# Patient Record
Sex: Male | Born: 1960
Health system: Southern US, Community
[De-identification: ages and names within clinical notes are randomized; demographics above are authoritative.]

## PROBLEM LIST (undated history)

## (undated) DIAGNOSIS — F1120 Opioid dependence, uncomplicated: Secondary | ICD-10-CM

## (undated) DIAGNOSIS — E785 Hyperlipidemia, unspecified: Secondary | ICD-10-CM

## (undated) DIAGNOSIS — I209 Angina pectoris, unspecified: Secondary | ICD-10-CM

## (undated) DIAGNOSIS — Z960 Presence of urogenital implants: Secondary | ICD-10-CM

## (undated) DIAGNOSIS — I10 Essential (primary) hypertension: Secondary | ICD-10-CM

## (undated) DIAGNOSIS — R21 Rash and other nonspecific skin eruption: Secondary | ICD-10-CM

## (undated) DIAGNOSIS — N400 Enlarged prostate without lower urinary tract symptoms: Secondary | ICD-10-CM

## (undated) DIAGNOSIS — I219 Acute myocardial infarction, unspecified: Secondary | ICD-10-CM

## (undated) DIAGNOSIS — Z720 Tobacco use: Secondary | ICD-10-CM

## (undated) DIAGNOSIS — I2510 Atherosclerotic heart disease of native coronary artery without angina pectoris: Secondary | ICD-10-CM

## (undated) HISTORY — PX: APPENDECTOMY: SHX54

## (undated) HISTORY — PX: CORONARY ANGIOPLASTY: SHX604

## (undated) HISTORY — PX: HERNIA REPAIR: SHX51

## (undated) HISTORY — PX: EYE SURGERY: SHX253

---

## 1998-12-25 ENCOUNTER — Encounter: Payer: Self-pay | Admitting: Cardiology

## 1998-12-25 ENCOUNTER — Inpatient Hospital Stay (HOSPITAL_COMMUNITY): Admission: EM | Admit: 1998-12-25 | Discharge: 1998-12-30 | Payer: Self-pay | Admitting: Emergency Medicine

## 2008-11-22 HISTORY — PX: OTHER SURGICAL HISTORY: SHX169

## 2012-01-05 ENCOUNTER — Inpatient Hospital Stay (HOSPITAL_COMMUNITY)
Admission: AC | Admit: 2012-01-05 | Discharge: 2012-01-06 | DRG: 853 | Disposition: A | Discharge status: Home / Self Care | Payer: BC Managed Care – PPO | Source: Other Acute Inpatient Hospital | Attending: Internal Medicine | Admitting: Internal Medicine

## 2012-01-05 ENCOUNTER — Other Ambulatory Visit: Payer: Self-pay

## 2012-01-05 ENCOUNTER — Encounter (HOSPITAL_COMMUNITY)
Admission: AC | Disposition: A | Discharge status: Home / Self Care | Payer: Self-pay | Source: Other Acute Inpatient Hospital | Attending: Internal Medicine

## 2012-01-05 ENCOUNTER — Encounter (HOSPITAL_COMMUNITY): Payer: Self-pay | Admitting: *Deleted

## 2012-01-05 DIAGNOSIS — I2119 ST elevation (STEMI) myocardial infarction involving other coronary artery of inferior wall: Secondary | ICD-10-CM

## 2012-01-05 DIAGNOSIS — Z720 Tobacco use: Secondary | ICD-10-CM

## 2012-01-05 DIAGNOSIS — I472 Ventricular tachycardia, unspecified: Secondary | ICD-10-CM | POA: Diagnosis present

## 2012-01-05 DIAGNOSIS — I2510 Atherosclerotic heart disease of native coronary artery without angina pectoris: Secondary | ICD-10-CM

## 2012-01-05 DIAGNOSIS — I959 Hypotension, unspecified: Secondary | ICD-10-CM

## 2012-01-05 DIAGNOSIS — I214 Non-ST elevation (NSTEMI) myocardial infarction: Secondary | ICD-10-CM

## 2012-01-05 DIAGNOSIS — F1010 Alcohol abuse, uncomplicated: Secondary | ICD-10-CM | POA: Diagnosis present

## 2012-01-05 DIAGNOSIS — F17200 Nicotine dependence, unspecified, uncomplicated: Secondary | ICD-10-CM | POA: Diagnosis present

## 2012-01-05 DIAGNOSIS — I10 Essential (primary) hypertension: Secondary | ICD-10-CM | POA: Diagnosis present

## 2012-01-05 DIAGNOSIS — D45 Polycythemia vera: Secondary | ICD-10-CM | POA: Diagnosis present

## 2012-01-05 DIAGNOSIS — F1120 Opioid dependence, uncomplicated: Secondary | ICD-10-CM | POA: Diagnosis present

## 2012-01-05 DIAGNOSIS — I4729 Other ventricular tachycardia: Secondary | ICD-10-CM

## 2012-01-05 HISTORY — PX: LEFT HEART CATHETERIZATION WITH CORONARY ANGIOGRAM: SHX5451

## 2012-01-05 HISTORY — DX: Angina pectoris, unspecified: I20.9

## 2012-01-05 HISTORY — DX: Opioid dependence, uncomplicated: F11.20

## 2012-01-05 HISTORY — DX: Essential (primary) hypertension: I10

## 2012-01-05 HISTORY — DX: Atherosclerotic heart disease of native coronary artery without angina pectoris: I25.10

## 2012-01-05 HISTORY — DX: Tobacco use: Z72.0

## 2012-01-05 HISTORY — DX: Hyperlipidemia, unspecified: E78.5

## 2012-01-05 HISTORY — PX: PERCUTANEOUS CORONARY STENT INTERVENTION (PCI-S): SHX5485

## 2012-01-05 LAB — CARDIAC PANEL(CRET KIN+CKTOT+MB+TROPI)
CK, MB: 38 ng/mL (ref 0.3–4.0)
Relative Index: 7.2 — ABNORMAL HIGH (ref 0.0–2.5)
Total CK: 530 U/L — ABNORMAL HIGH (ref 7–232)
Troponin I: 2.25 ng/mL (ref <0.30)

## 2012-01-05 LAB — BASIC METABOLIC PANEL
BUN: 7 mg/dL (ref 6–23)
CO2: 26 mEq/L (ref 19–32)
Calcium: 9.9 mg/dL (ref 8.4–10.5)
Chloride: 104 mEq/L (ref 96–112)
Creatinine, Ser: 0.87 mg/dL (ref 0.50–1.35)
GFR calc Af Amer: >90 mL/min (ref >90)
GFR calc non Af Amer: >90 mL/min (ref >90)
Glucose, Bld: 101 mg/dL — ABNORMAL HIGH (ref 70–99)
Potassium: 3.8 mEq/L (ref 3.5–5.1)
Sodium: 138 mEq/L (ref 135–145)

## 2012-01-05 LAB — MRSA PCR SCREENING: MRSA by PCR: NEGATIVE

## 2012-01-05 LAB — POCT ACTIVATED CLOTTING TIME: Activated Clotting Time: 551 seconds

## 2012-01-05 SURGERY — LEFT HEART CATHETERIZATION WITH CORONARY ANGIOGRAM
Anesthesia: LOCAL

## 2012-01-05 MED ORDER — HYDROCODONE-ACETAMINOPHEN 5-325 MG PO TABS: 1.0000 | ORAL_TABLET | Freq: Four times a day (QID) | ORAL | Status: DC | PRN

## 2012-01-05 MED ORDER — PRASUGREL HCL 10 MG PO TABS
10.0000 mg | ORAL_TABLET | Freq: Every day | ORAL | Status: DC
  Administered 2012-01-06: 10 mg via ORAL
  Filled 2012-01-05: qty 1

## 2012-01-05 MED ORDER — SODIUM CHLORIDE 0.9 % IV BOLUS (SEPSIS)
500.0000 mL | Freq: Once | INTRAVENOUS | Status: AC
  Administered 2012-01-05: 04:00:00 via INTRAVENOUS

## 2012-01-05 MED ORDER — ONDANSETRON HCL 4 MG/2ML IJ SOLN: 4.0000 mg | Freq: Four times a day (QID) | INTRAMUSCULAR | Status: DC | PRN

## 2012-01-05 MED ORDER — MIDAZOLAM HCL 2 MG/2ML IJ SOLN
INTRAMUSCULAR | Status: AC
  Filled 2012-01-05: qty 2

## 2012-01-05 MED ORDER — ASPIRIN 81 MG PO CHEW
324.0000 mg | CHEWABLE_TABLET | ORAL | Status: AC
  Administered 2012-01-05: 324 mg via ORAL
  Filled 2012-01-05: qty 4

## 2012-01-05 MED ORDER — VERAPAMIL HCL 2.5 MG/ML IV SOLN
INTRAVENOUS | Status: AC
  Filled 2012-01-05: qty 2

## 2012-01-05 MED ORDER — ASPIRIN 300 MG RE SUPP
300.0000 mg | RECTAL | Status: AC
  Filled 2012-01-05: qty 1

## 2012-01-05 MED ORDER — BIVALIRUDIN 250 MG IV SOLR
INTRAVENOUS | Status: AC
  Filled 2012-01-05: qty 250

## 2012-01-05 MED ORDER — MAGNESIUM SULFATE 40 MG/ML IJ SOLN
2.0000 g | Freq: Once | INTRAMUSCULAR | Status: AC
  Administered 2012-01-05: 2 g via INTRAVENOUS
  Filled 2012-01-05: qty 50

## 2012-01-05 MED ORDER — FENTANYL CITRATE 0.05 MG/ML IJ SOLN
INTRAMUSCULAR | Status: AC
  Filled 2012-01-05: qty 2

## 2012-01-05 MED ORDER — NITROGLYCERIN 2 % TD OINT: 1.0000 [in_us] | TOPICAL_OINTMENT | Freq: Four times a day (QID) | TRANSDERMAL | Status: DC

## 2012-01-05 MED ORDER — HEPARIN SODIUM (PORCINE) 1000 UNIT/ML IJ SOLN
INTRAMUSCULAR | Status: AC
  Filled 2012-01-05: qty 1

## 2012-01-05 MED ORDER — MORPHINE SULFATE 2 MG/ML IJ SOLN: 2.0000 mg | INTRAMUSCULAR | Status: DC | PRN

## 2012-01-05 MED ORDER — PRASUGREL HCL 10 MG PO TABS
ORAL_TABLET | ORAL | Status: AC
  Filled 2012-01-05: qty 6

## 2012-01-05 MED ORDER — NITROGLYCERIN 0.2 MG/ML ON CALL CATH LAB
INTRAVENOUS | Status: AC
  Filled 2012-01-05: qty 1

## 2012-01-05 MED ORDER — ASPIRIN 325 MG PO TABS
325.0000 mg | ORAL_TABLET | Freq: Every day | ORAL | Status: DC
  Filled 2012-01-05: qty 1

## 2012-01-05 MED ORDER — SODIUM CHLORIDE 0.9 % IV SOLN
INTRAVENOUS | Status: AC
  Administered 2012-01-05: 14:00:00 via INTRAVENOUS

## 2012-01-05 MED ORDER — HEPARIN SOD (PORCINE) IN D5W 100 UNIT/ML IV SOLN
1000.0000 [IU]/h | INTRAVENOUS | Status: DC
  Administered 2012-01-05: 1000 [IU]/h via INTRAVENOUS
  Filled 2012-01-05: qty 250

## 2012-01-05 MED ORDER — MORPHINE SULFATE 2 MG/ML IJ SOLN
INTRAMUSCULAR | Status: AC
  Administered 2012-01-05: 2 mg via INTRAVENOUS
  Filled 2012-01-05: qty 18

## 2012-01-05 MED ORDER — LIDOCAINE HCL (PF) 1 % IJ SOLN
INTRAMUSCULAR | Status: AC
  Filled 2012-01-05: qty 30

## 2012-01-05 MED ORDER — ACETAMINOPHEN 325 MG PO TABS
650.0000 mg | ORAL_TABLET | ORAL | Status: DC | PRN
  Administered 2012-01-05: 650 mg via ORAL
  Filled 2012-01-05: qty 2

## 2012-01-05 MED ORDER — ASPIRIN EC 325 MG PO TBEC: 325.0000 mg | DELAYED_RELEASE_TABLET | Freq: Every day | ORAL | Status: DC

## 2012-01-05 MED ORDER — ROSUVASTATIN CALCIUM 40 MG PO TABS
40.0000 mg | ORAL_TABLET | Freq: Every day | ORAL | Status: DC
  Administered 2012-01-06: 40 mg via ORAL
  Filled 2012-01-05 (×2): qty 1

## 2012-01-05 MED ORDER — SODIUM CHLORIDE 0.9 % IV SOLN
INTRAVENOUS | Status: DC
  Administered 2012-01-05: 06:00:00 via INTRAVENOUS

## 2012-01-05 MED ORDER — HEPARIN (PORCINE) IN NACL 2-0.9 UNIT/ML-% IJ SOLN
INTRAMUSCULAR | Status: AC
  Filled 2012-01-05: qty 1000

## 2012-01-05 MED ORDER — ASPIRIN 81 MG PO CHEW
81.0000 mg | CHEWABLE_TABLET | Freq: Every day | ORAL | Status: DC
  Administered 2012-01-06: 81 mg via ORAL
  Filled 2012-01-05: qty 1

## 2012-01-05 MED ORDER — HEART ATTACK BOUNCING BOOK
Freq: Once | Status: DC
  Filled 2012-01-05: qty 1

## 2012-01-05 MED ORDER — HYDROMORPHONE HCL PF 2 MG/ML IJ SOLN
INTRAMUSCULAR | Status: AC
  Filled 2012-01-05: qty 1

## 2012-01-05 MED ORDER — NITROGLYCERIN 0.4 MG SL SUBL: 0.4000 mg | SUBLINGUAL_TABLET | SUBLINGUAL | Status: DC | PRN

## 2012-01-05 MED FILL — Heparin Sodium (Porcine) 100 Unt/ML in Sodium Chloride 0.45%: INTRAMUSCULAR | Qty: 250 | Status: AC

## 2012-01-05 MED FILL — Fentanyl Citrate Inj 0.05 MG/ML: INTRAMUSCULAR | Qty: 2 | Status: AC

## 2012-01-05 NOTE — Progress Notes (Signed)
I have seen [name] this am and reviewed his chart. Labs are ok for cath. Risks and benefits of procedure reviewed with pt. He agrees to proceed. Left heart cath with possible PCI this am from right radial artery.   Zaryia Markel 8:20 AM 01/05/2012

## 2012-01-05 NOTE — Interval H&P Note (Signed)
History and Physical Interval Note:  01/05/2012 9:38 AM  Adrian Rivera  has presented today for cardiac catheterization with possible PCI with the diagnosis of cp  The various methods of treatment have been discussed with the patient and family. After consideration of risks, benefits and other options for treatment, the patient has consented to  Procedure(s) (LRB): LEFT HEART CATHETERIZATION WITH CORONARY ANGIOGRAM (N/A) as a surgical intervention .  The patients' history has been reviewed, patient examined, no change in status, stable for surgery.  I have reviewed the patients' chart and labs.  Questions were answered to the patient's satisfaction.     Madelynne Lasker

## 2012-01-05 NOTE — Progress Notes (Signed)
ANTICOAGULATION CONSULT NOTE - Initial Consult  Pharmacy Consult for heparin   Indication: chest pain/ACS  No Known Allergies  Patient Measurements: Height: 5\' 10"  (177.8 cm) Weight: 161 lb 9.6 oz (73.3 kg) IBW/kg (Calculated) : 73  Heparin Dosing Weight:   Vital Signs: Temp: 97.7 F (36.5 C) (02/13 0139) Temp src: Oral (02/13 0139) BP: 93/61 mmHg (02/13 0139) Pulse Rate: 45  (02/13 0139)  Labs: No results found for this basename: HGB:2,HCT:3,PLT:3,APTT:3,LABPROT:3,INR:3,HEPARINUNFRC:3,CREATININE:3,CKTOTAL:3,CKMB:3,TROPONINI:3 in the last 72 hours CrCl is unknown because no creatinine reading has been taken.  Medical History: Past Medical History  Diagnosis Date  . Angina   . Hypertension   . Headache     Medications:  No prescriptions prior to admission    Assessment: ACS transfer from [facility]. Heparin infusing at 1000 units/hr  Goal of Therapy:  Heparin level 0.3-0.7 units/ml   Plan:  Continue at 1000 units/hr. HL at 0800. Daily HL and CBC starting 2/14 with am labs if still continued  Janice Coffin 01/05/2012,2:16 AM

## 2012-01-05 NOTE — H&P (Addendum)
PCP: Doreen Beam  HPI:  51 y/o male with HTN, HL, ongoing tobacco/ETOH/oxycontin use and CAD. Transferred from [place] with MI.  Reports having a cath in 2000 at [facility] (no records in [place]). Says 1 artery was totally blocked and got stents to the other 2. Has not followed with cardiologist since. Smokes 25 cigs per day and reports 8 beers on the weekend.   Today about 3p had CP. Got better then recurred at 6p. Went to [place] with 5/10 CP. Initial ECG with inferior Q waves and mild ST elevation. Quickly made pain free with NTG and fentanyl. Heparin started. 1st set of CE negative. Transferred here. ECG with progressive inferior Qs and resolution of ST elevation.  BP 89/50. Feels fine. 15 beat run NSVT (asx). Denies HF, claudication, melena, BRBPR or DM2.  Review of Systems:     Cardiac Review of Systems: {Y] = yes [ ]  = no  Chest Pain [ y   ]  Resting SOB [   ] Exertional SOB  [  ]  Orthopnea [  ]   Pedal Edema [   ]    Palpitations [  ] Syncope  [  ]   Presyncope [   ]  General Review of Systems: [Y] = yes [  ]=no Constitional: recent weight change [  ]; anorexia [  ]; fatigue [  ]; nausea [  ]; night sweats [  ]; fever [  ]; or chills [  ];                                                                 Eye : blurred vision [  ]; diplopia [   ]; vision changes [  ];  Amaurosis fugax[  ]; Resp: cough [  ];  wheezing[  ];  hemoptysis[  ]; shortness of breath[  ]; paroxysmal nocturnal dyspnea[  ]; dyspnea on exertion[  ]; or orthopnea[  ];  GI:  gallstones[  ], vomiting[  ];  dysphagia[  ]; melena[  ];  hematochezia [  ]; heartburn[  ];   GU: kidney stones [  ]; hematuria[  ];   dysuria [  ];  nocturia[  ];  history of     obstruction [  ];                 Skin: rash, swelling[  ];, hair loss[  ];  peripheral edema[  ];  or itching[  ]; Musculosketetal: myalgias[  ];  joint swelling[  ];  joint erythema[  ];  joint pain[  ];  back pain[  ];  Heme/Lymph: bruising[  ];  bleeding[  ];   anemia[  ];  Neuro: TIA[  ];  headaches[  ];  stroke[  ];  vertigo[  ];  seizures[  ];   paresthesias[  ];  difficulty walking[  ];  Psych:depression[  ]; anxiety[  ];  Endocrine: diabetes[  ];  thyroid dysfunction[  ];   Other:  Past Medical History  Diagnosis Date  . Angina   . Hypertension   . Headache   . CAD (coronary artery disease)     cath 200. 1 artery block. other 2 arteries stented  . Tobacco use   . Alcohol  abuse, episodic     8 beers per weekend  . Opioid dependence   . Hyperlipidemia     Medications Prior to Admission  Medication Dose Route Frequency Provider Last Rate Last Dose  . aspirin tablet 325 mg  325 mg Oral Daily Bevelyn Buckles Adelia Baptista, MD      . heparin ADULT infusion 100 units/ml (25000 units/250 ml)  1,000 Units/hr Intravenous Continuous Dolores Patty, MD       No current outpatient prescriptions on file as of 01/05/2012.  Meds:  Lopressor Fish oil ASA 81 Lisinopril Lovastatin    (unknown doses)   No Known Allergies  History   Social History  . Marital Status: Married    Spouse Name: N/A    Number of Children: N/A  . Years of Education: N/A   Occupational History  . Not on file.   Social History Main Topics  . Smoking status: Current Everyday Smoker -- 1.2 packs/day for 18 years    Types: Cigarettes  . Smokeless tobacco: Not on file  . Alcohol Use: 4.8 oz/week    8 Cans of beer per week  . Drug Use: 6 per week    Special: Hydromorphone  . Sexually Active: Yes   Other Topics Concern  . Not on file   Social History Narrative  . No narrative on file    No family history on file.  PHYSICAL EXAM: Filed Vitals:   01/05/12 0315  BP: 96/59  Pulse: 59  Temp:   Resp: 16   General:  Well appearing. No respiratory difficulty HEENT: normal Neck: supple. no JVD. Carotids 2+ bilat; no bruits. No lymphadenopathy or thryomegaly appreciated. Cor: PMI nondisplaced. Regular rate & rhythm. No rubs, gallops or murmurs. Lungs:  clear Abdomen: soft, nontender, nondistended. No hepatosplenomegaly. No bruits or masses. Good bowel sounds. Extremities: no cyanosis, clubbing, rash, edema Neuro: alert & oriented x 3, cranial nerves grossly intact. moves all 4 extremities w/o difficulty. Affect pleasant.  ECG: Sinus brady 48 Inferior Qs. Probable posterior involvement  Results for orders placed during the hospital encounter of 01/05/12 (from the past 24 hour(s))  CARDIAC PANEL(CRET KIN+CKTOT+MB+TROPI)     Status: Abnormal   Collection Time   01/05/12  1:55 AM      Component Value Range   Total CK 530 (*) 7 - 232 (U/L)   CK, MB 38.0 (*) 0.3 - 4.0 (ng/mL)   Troponin I 2.25 (*) <0.30 (ng/mL)   Relative Index 7.2 (*) 0.0 - 2.5    No results found.  CXR: Minimal atelectasis   Labs from [facility]:  Na 138 K 3.8 CR 0.97 Glu 100  WBC 7.4 HgB 15.2  Plt 205 MCV 92  INR 1.0  ASSESSMENT:  1. Inferior-posterior MI  2. H/o CAD s/p 2 vessel stenting in 2000 3. Ongoing tobacco use 4. ETOH abuse  5. Opioid (Oxycontin) dependence 6. HTN 7. HL 8. NSVT 9. Polycythemia 10. Hypotension  PLAN/DISCUSSION:  Currently pain free with resolution of ST elevation. Will treat with ASA, NTG (as BP tolerates),  Statin and heparin. Gentle IVF. No thienopyridine due to known multivessel CAD. Keep K+ > 4.0 and mag > 2.0. No b-blocker yet due to bradycardia and hypotension. Watch for withdrawal symptoms.  Smoking cessation consult placed. Cath later today.   Jakelin Taussig,MD 3:59 AM

## 2012-01-05 NOTE — Op Note (Signed)
Cardiac Catheterization Operative Report  Ishmail Mcmanamon [id number] 2/13/201311:02 AM No primary provider on file.  Procedure Performed:  1. Left Heart Catheterization 2. Selective Coronary Angiography 3. Left ventricular angiogram 4. PTCA/DES x 1 OM2 5. PTCA/DES x 1 mid Circumflex (different lesions)  Operator: Verne Carrow, MD  Indication:  NSTEMI. Pt with known CAD with cath in 2000 with bare metal stent placed in the mid RCA and in the mid LAD for CTO of the mid LAD. He has continued to smoke daily over the last 13 years. He is now admitted with a positive troponin.                                      Procedure Details: The risks, benefits, complications, treatment options, and expected outcomes were discussed with the patient. The patient and/or family concurred with the proposed plan, giving informed consent. The patient was brought to the cath lab after IV hydration was begun and oral premedication was given. The patient was further sedated with Versed and Fentanyl. The right wrist was assessed with an Allen's test. The right wrist was prepped and draped in a sterile fashion. 1% lidocaine was used for local anesthesia. A 5 French sheath was placed in the right radial artery. 3 mg IV Verapamil was given through the sheath. 4000 units IV heparin was given.  Standard diagnostic catheters were used to perform selective coronary angiography. A pigtail catheter was used to perform a left ventricular angiogram. I initially thought the LAD was occluded distally and made plans to end the case and consider CABG. The sheath was removed from the right wrist. A Terumo hemostasis band was applied to the right wrist. I then reviewed the films more closely and it was apparent that the LAD was a small caliber, diminutive vessel beyond the stented segment. The mid LAD stent actually extended into the diagonal branch and was patent. It was felt that the RCA occlusion was chronic and the likely  culprit vessel was the mid Circumflex or OM2 lesion. I discussed the films with my colleagues while leaving the patient on the table. I decided to pursue PTCA of the circumflex lesions and continue medical management of the other disease.   The right groin was prepped and draped in the usual manner. Using the modified Seldinger access technique, a 6 French sheath was placed in the right femoral artery. The patient was given a bolus of Angiomax and a drip was started. He was given 60 mg Effient po. I then engaged the left main artery with a 6 French XB 3.0 guiding catheter. When the ACT was greater than 200, I passed a Cougar IC wire down the Circumflex into the first OM branch. I then passed a second Cougar IC wire into the second OM branch. A 2.0 x 12 mm balloon was used to post-dilate the stenosis in the second OM branch. I then used a 2.5 x 12 mm balloon to pre-dilate the stenosis in the mid vessel as I could not pass the stent beyond it. A 2.25 x 16 mm Promus Element DES was deployed in the second OM branch. This was a small vessel with good expansion of the stent. No post-dilatation was performed. I then turned my attention to the mid lesion and placed a 3.5 x 20 mm Promus Element DES in the mid vessel. A 3.75 x 15 mm [place] balloon was used to post-dilate  the stent. There was an excellent result with good flow into the distal vessel. An Angioseal femoral artery closure was placed in the right femoral artery.   There were no immediate complications. The patient was taken to the recovery area in stable condition.   Hemodynamic Findings: Central aortic pressure: 109/68 Left ventricular pressure: 109/6/13  Angiographic Findings:  Left main: No obstructive disease.   Left Anterior Descending Artery: This is a moderate sized vessel throughout the proximal and mid segments. There is a 50% stenosis in the proximal vessel at the takeoff of the first septal branch. The mid vessel has a patent stent that  extends down into the Diagonal branch. This is patent with minimal in-stent restenosis. There is no distribution of the LAD to the apex in a classic pattern. There is a small branch after the diagonal that does not reach the apex.   Circumflex Artery: Large, dominant vessel with ulcerated 80% stenosis mid vessel. The distal AV groove Circumflex has a 99% stenosis.   Right Coronary Artery: 100% occlusion mid vessel within the previously placed stent. The distal vessel is seen to fill from left to right collaterals.   Left Ventricular Angiogram: LVEF 60%. No wall motion abnormalities.   Impression: 1. Triple vessel CAD with chronic occlusion of the mid RCA, patent stent LAD, new stenoses in the Circumflex that are most likely the culprit for his NSTEMI.  2. Preserved LV systolic function 3. PTCA/DES x 1 OM2 4. PTCA/DES x 1 mid Circumflex  Recommendations: ASA, Effient, beta blocker, statin. Tobacco cessation.        Complications:  None. The patient tolerated the procedure well.

## 2012-01-06 ENCOUNTER — Encounter (HOSPITAL_COMMUNITY): Payer: Self-pay

## 2012-01-06 ENCOUNTER — Other Ambulatory Visit: Payer: Self-pay

## 2012-01-06 DIAGNOSIS — I214 Non-ST elevation (NSTEMI) myocardial infarction: Secondary | ICD-10-CM

## 2012-01-06 LAB — CBC
HCT: 44.9 % (ref 39.0–52.0)
Hemoglobin: 15.7 g/dL (ref 13.0–17.0)
MCH: 31.3 pg (ref 26.0–34.0)
MCHC: 35 g/dL (ref 30.0–36.0)
MCV: 89.6 fL (ref 78.0–100.0)
Platelets: 206 10*3/uL (ref 150–400)
RBC: 5.01 MIL/uL (ref 4.22–5.81)
RDW: 13.4 % (ref 11.5–15.5)
WBC: 9.9 10*3/uL (ref 4.0–10.5)

## 2012-01-06 LAB — HEMOGLOBIN A1C
Hgb A1c MFr Bld: 5.7 % — ABNORMAL HIGH (ref <5.7)
Mean Plasma Glucose: 117 mg/dL — ABNORMAL HIGH (ref <117)

## 2012-01-06 LAB — BASIC METABOLIC PANEL
BUN: 6 mg/dL (ref 6–23)
CO2: 25 mEq/L (ref 19–32)
Calcium: 10 mg/dL (ref 8.4–10.5)
Chloride: 106 mEq/L (ref 96–112)
Creatinine, Ser: 0.9 mg/dL (ref 0.50–1.35)
GFR calc Af Amer: >90 mL/min (ref >90)
GFR calc non Af Amer: >90 mL/min (ref >90)
Glucose, Bld: 111 mg/dL — ABNORMAL HIGH (ref 70–99)
Potassium: 3.8 mEq/L (ref 3.5–5.1)
Sodium: 139 mEq/L (ref 135–145)

## 2012-01-06 LAB — LIPID PANEL
Cholesterol: 195 mg/dL (ref 0–200)
HDL: 52 mg/dL (ref >39)
LDL Cholesterol: 113 mg/dL — ABNORMAL HIGH (ref 0–99)
Total CHOL/HDL Ratio: 3.8 RATIO
Triglycerides: 149 mg/dL (ref <150)
VLDL: 30 mg/dL (ref 0–40)

## 2012-01-06 MED ORDER — METOPROLOL TARTRATE 25 MG PO TABS: 25.0000 mg | ORAL_TABLET | Freq: Two times a day (BID) | ORAL | Status: DC

## 2012-01-06 MED ORDER — ASPIRIN 81 MG PO CHEW: 81.0000 mg | CHEWABLE_TABLET | Freq: Every day | ORAL | Status: AC

## 2012-01-06 MED ORDER — ATORVASTATIN CALCIUM 40 MG PO TABS: 40.0000 mg | ORAL_TABLET | Freq: Every day | ORAL | Status: DC

## 2012-01-06 MED ORDER — METOPROLOL TARTRATE 25 MG PO TABS
25.0000 mg | ORAL_TABLET | Freq: Two times a day (BID) | ORAL | Status: DC
  Administered 2012-01-06: 25 mg via ORAL
  Filled 2012-01-06: qty 1

## 2012-01-06 MED ORDER — NITROGLYCERIN 0.4 MG SL SUBL: 0.4000 mg | SUBLINGUAL_TABLET | SUBLINGUAL | Status: DC | PRN

## 2012-01-06 MED ORDER — PRASUGREL HCL 10 MG PO TABS: 10.0000 mg | ORAL_TABLET | Freq: Every day | ORAL | Status: DC

## 2012-01-06 MED FILL — Dextrose Inj 5%: INTRAVENOUS | Qty: 50 | Status: AC

## 2012-01-06 NOTE — Progress Notes (Signed)
Pt smokes 2 ppd and is in contemplation stage. Discussed risk factors and how it affects his health. Pt verbalizes understanding. Discussed different quit options. Pt doesn't think he'll need medical aids to help him quit and will quit cold [place] when he is ready. Referred to 1-800 quit now for f/u and support. Discussed oral fixation substitutes, second hand smoke and in home smoking policy. Reviewed and gave pt Written education/contact information.

## 2012-01-06 NOTE — Discharge Summary (Signed)
Pt seen and full note written this am. cdm

## 2012-01-06 NOTE — Progress Notes (Signed)
UR Completed. Simmons, Koven Belinsky F [phone]  

## 2012-01-06 NOTE — Discharge Summary (Signed)
Discharge Summary   Patient ID: Adrian Rivera,  MRN: [id number], DOB/AGE: [birth year] 51 y.o.  Admit date: 01/05/2012 Discharge date: 01/06/2012  Discharge Diagnoses Principal Problem:  *NSTEMI (non-ST elevated myocardial infarction) Active Problems:  Inferior MI  NSVT (nonsustained ventricular tachycardia)  CAD (coronary artery disease)  Hypotension  Tobacco use   Allergies No Known Allergies  Procedures  Cardiac Catheterization with PCI  Hemodynamic Findings:  Central aortic pressure: 109/68  Left ventricular pressure: 109/6/13  Angiographic Findings:  Left main: No obstructive disease.  Left Anterior Descending Artery: This is a moderate sized vessel throughout the proximal and mid segments. There is a 50% stenosis in the proximal vessel at the takeoff of the first septal branch. The mid vessel has a patent stent that extends down into the Diagonal branch. This is patent with minimal in-stent restenosis. There is no distribution of the LAD to the apex in a classic pattern. There is a small branch after the diagonal that does not reach the apex.  Circumflex Artery: Large, dominant vessel with ulcerated 80% stenosis mid vessel. The distal AV groove Circumflex has a 99% stenosis.  Right Coronary Artery: 100% occlusion mid vessel within the previously placed stent. The distal vessel is seen to fill from left to right collaterals.  Left Ventricular Angiogram: LVEF 60%. No wall motion abnormalities.  Impression:  1. Triple vessel CAD with chronic occlusion of the mid RCA, patent stent LAD, new stenoses in the Circumflex that are most likely the culprit for his NSTEMI.  2. Preserved LV systolic function  3. PTCA/DES x 1 OM2  4. PTCA/DES x 1 mid Circumflex  Recommendations: ASA, Effient, beta blocker, statin. Tobacco cessation.  Complications: None. The patient tolerated the procedure well.   History of Present Illness  Mr. Woodford is a 51 yo male with a PMHx significant for  HTN, HL, tobacco, alcohol and oxycontin use and CAD (s/p BMS-RCA and mid LAD in 2000) who was trasferred from [facility] with NSTEMI.   He reported having a cath in 2000 at [facility] (no records in [place]). Says 1 artery was totally blocked and got stents to the other 2. Had not followed with cardiologist since. Smokes 25 cigs per day and reports 8 beers on the weekend.   On 02/13, about 3p had CP. Got better then recurred at 6p. Went to [place] with 5/10 CP. Initial ECG with inferior Q waves and mild ST elevation. Quickly made pain free with NTG and fentanyl. Heparin started. 1st set of CE negative. Transferred here. ECG with progressive inferior Qs and resolution of ST elevation.   BP 89/50. Feels fine. 15 beat run NSVT (asx). Denies HF, claudication, melena, BRBPR or DM2. CEs while at [facility] were elevated.  He was subsequently admitted for ACS with scheduled cardiac catheterization the same day.  Hospital Course   He was heparinized, gently hydrated and BB held 2/2 mild bradycardia and hypotension. Thienopyridine was initially held due to stated history of multivessel CAD. He was informed, consented and agreed to the procedure which revealed the above details including triple vessel CAD with chronic occlusion of the mid RCA, patent stent LAD, new stenoses in the Circumflex that were the designated culprit lesions for NSTEMI. He was found to have preserved LV systolic function. Interventions included PTCA/DES x 1 OM2 and PTCA/DES x 1 mid LCx. The recommendation was made for DAPT- ASA/Effient, BB, statin and tobacco cessation. He had no immediate complications post-procedure.   There were no overnight events on telemetry or EKG.  He is asymptomatic today. Catheterization site without evidence of infection, hematoma or pseudoaneurysm. He will be discharged on ASA/Effient/low-dose Lopressor/statin/NTG SL PRN. He has undergone tobacco cessation counseling in the hospital and this has been stressed. I have given him  prescriptions for Effient x 1 month without refills, and Effient x 3 months with refills to satisfy requirements for assistance with this med. I have also given him Lipitor 40mg  to replace home Mevacor 20mg  due to LDL of 117 this hospitalization. This will be followed and managed in the [address] office. I have made an appointment with him with Dr. Kirke Corin in approx 2 weeks. This information including activity restrictions have been clearly outlined for him on the discharge summary.   Discharge Vitals:  Blood pressure 121/67, pulse 92, temperature 98.4 F (36.9 C), temperature source Oral, resp. rate 20, height 5\' 10"  (1.778 m), weight 74.3 kg (163 lb 12.8 oz), SpO2 98.00%.  Weight change: 1 kg (2 lb 3.3 oz)  Labs: Recent Labs  Basename 01/06/12 0500   WBC 9.9   HGB 15.7   HCT 44.9   MCV 89.6   PLT 206    Lab 01/06/12 0500 01/05/12 1422  NA 139 138  K 3.8 3.8  CL 106 104  CO2 25 26  BUN 6 7  CREATININE 0.90 0.87  CALCIUM 10.0 9.9  PROT -- --  BILITOT -- --  ALKPHOS -- --  ALT -- --  AST -- --  AMYLASE -- --  LIPASE -- --  GLUCOSE 111* 101*   Recent Labs  Basename 01/05/12 1422   HGBA1C 5.7*   Recent Labs  [facility] 01/05/12 0155   CKTOTAL 530*   CKMB 38.0*   CKMBINDEX --   TROPONINI 2.25*   No components found with this basename: POCBNP Recent Labs  Basename 01/06/12 0500   CHOL 195   HDL 52   LDLCALC 113*   TRIG 149   CHOLHDL 3.8   LDLDIRECT --   No results found for this basename: TSH,T4TOTAL,FREET3,T3FREE,THYROIDAB in the last 72 hours  Disposition:  Discharge Orders    Future Appointments: Provider: Department: Dept Phone: Center:   01/18/2012 3:30 PM Iran Ouch, MD [facility] [phone] [facility]     Follow-up Information    Follow up with Lorine Bears, MD on 01/18/2012. (At 3:30 PM)    Contact information:   [address] [place] [place] [address] [phone]         Discharge Medications:  Medication List  As of  01/06/2012 11:36 AM   START taking these medications         aspirin 81 MG chewable tablet   Chew 1 tablet (81 mg total) by mouth daily.      atorvastatin 40 MG tablet   Commonly known as: LIPITOR   Take 1 tablet (40 mg total) by mouth daily.      nitroGLYCERIN 0.4 MG SL tablet   Commonly known as: NITROSTAT   Place 1 tablet (0.4 mg total) under the tongue every 5 (five) minutes x 3 doses as needed for chest pain.      prasugrel 10 MG Tabs   Commonly known as: EFFIENT   Take 1 tablet (10 mg total) by mouth daily.         CHANGE how you take these medications         metoprolol tartrate 25 MG tablet   Commonly known as: LOPRESSOR   Take 1 tablet (25 mg total) by  mouth 2 (two) times daily.   What changed: - medication strength - dose         CONTINUE taking these medications         lisinopril 10 MG tablet   Commonly known as: PRINIVIL,ZESTRIL         STOP taking these medications         lovastatin 20 MG tablet          Where to get your medications    These are the prescriptions that you need to pick up. We sent them to a specific pharmacy, so you will need to go there to get them.   [facility] - [place], [place] - [address]    [address] [place] [address]    Phone: [phone]        metoprolol tartrate 25 MG tablet   nitroGLYCERIN 0.4 MG SL tablet   prasugrel 10 MG Tabs         You may get these medications from any pharmacy.         aspirin 81 MG chewable tablet   atorvastatin 40 MG tablet           Outstanding Labs/Studies: None  Duration of Discharge Encounter: 40 minutes including physician time.  Signed, R. Hurman Horn, PA-C 01/06/2012, 11:36 AM

## 2012-01-06 NOTE — Discharge Instructions (Signed)
PLEASE REMEMBER TO BRING ALL OF YOUR MEDICATIONS TO EACH OF YOUR FOLLOW-UP OFFICE VISITS.  Activity: Increase activity slowly as tolerated. You may shower, but no soaking baths for 6 days. No driving for 1 day. No lifting over 5 lbs for 6 days. No sexual activity for 6 days.   You May Return to Work: in 1 week  Wound Care: You may wash cath site gently with soap and water. Keep cath site clean and dry. If you notice pain, swelling, bleeding or pus at your cath site, please call [phone].  

## 2012-01-06 NOTE — Progress Notes (Signed)
   CARE MANAGEMENT NOTE 01/06/2012  Patient:  Adrian Rivera, Adrian Rivera   Account Number:  [id number]  Date Initiated:  01/06/2012  Documentation initiated by:  GRAVES-BIGELOW,Zyden Suman  Subjective/Objective Assessment:   Pt admitted for cardiac catheterization. Plan for d/c today.     Action/Plan:   CM will provide pt with 30 day free effient card and discount co pay card. MD please write rx for 30 day free no refills and then the original rx with refills   Anticipated DC Date:  01/06/2012   Anticipated DC Plan:  HOME/SELF CARE      DC Planning Services  CM consult      Choice offered to / List presented to:             Status of service:  Completed, signed off Medicare Important Message given?   (If response is "NO", the following Medicare IM given date fields will be blank) Date Medicare IM given:   Date Additional Medicare IM given:    Discharge Disposition:  HOME/SELF CARE  Per UR Regulation:    Comments:  01-06-12 1015 Tomi Bamberger, RN, BSN [phone] No other needs assessed by CM at this time.

## 2012-01-06 NOTE — Progress Notes (Signed)
SUBJECTIVE: No events overnight. NO chest pain or SOB.   BP 112/84  Pulse 82  Temp(Src) 98.6 F (37 C) (Oral)  Resp 17  Ht 5\' 10"  (1.778 m)  Wt 163 lb 12.8 oz (74.3 kg)  BMI 23.50 kg/m2  SpO2 98%  Intake/Output Summary (Last 24 hours) at 01/06/12 7829 Last data filed at 01/05/12 1500  Gross per 24 hour  Intake    540 ml  Output    500 ml  Net     40 ml    PHYSICAL EXAM General: Well developed, well nourished, in no acute distress. Alert and oriented x 3.  Psych:  Good affect, responds appropriately Neck: No JVD. No masses noted.  Lungs: Clear bilaterally with no wheezes or rhonci noted.  Heart: RRR with no murmurs noted. Abdomen: Bowel sounds are present. Soft, non-tender.  Extremities: No lower extremity edema. Right wrist and right groin cath sites ok.   LABS: Basic Metabolic Panel:  Basename 01/06/12 0500 01/05/12 1422  NA 139 138  K 3.8 3.8  CL 106 104  CO2 25 26  GLUCOSE 111* 101*  BUN 6 7  CREATININE 0.90 0.87  CALCIUM 10.0 9.9  MG -- --  PHOS -- --   CBC:  Basename 01/06/12 0500  WBC 9.9  NEUTROABS --  HGB 15.7  HCT 44.9  MCV 89.6  PLT 206   Cardiac Enzymes:  Basename 01/05/12 0155  CKTOTAL 530*  CKMB 38.0*  CKMBINDEX --  TROPONINI 2.25*   Fasting Lipid Panel:  Basename 01/06/12 0500  CHOL 195  HDL 52  LDLCALC 113*  TRIG 149  CHOLHDL 3.8  LDLDIRECT --    Current Meds:    . aspirin  324 mg Oral NOW   Or  . aspirin  300 mg Rectal NOW  . aspirin  81 mg Oral Daily  . bivalirudin      . fentaNYL      . fentaNYL      . heart attack bouncing book   Does not apply Once  . heparin      . heparin      . HYDROmorphone      . lidocaine      . midazolam      . midazolam      . morphine      . nitroGLYCERIN      . nitroGLYCERIN      . prasugrel      . prasugrel  10 mg Oral Daily  . rosuvastatin  40 mg Oral Daily  . verapamil      . DISCONTD: aspirin EC  325 mg Oral Daily  . DISCONTD: aspirin  325 mg Oral Daily      ASSESSMENT AND PLAN:  1. CAD/NSTEMI: Pt transferred from [facility] with NSTEMI. He has history of CAD with bare metal stents in RCA and mid LAD in 2000. He has continued to smoke daily. Cath yesterday with two severe stenoses in the mid Circumflex and OM2,now s/p DES in both areas. His LAD is patent with patent mid stent and moderate proximal stenosis. The RCA is chronically occluded and fills distally from left to right collaterals. He will need ASA, Effient for at least one year. Continue statin. Will start low dose beta blocker today. Will need Effient starter pack.   2. Disposition: Will d/c home today. Follow up can be planned in the [facility] cardiology office. He has not been seen in that office before.  Adrian Rivera  2/14/20137:07 AM

## 2012-01-06 NOTE — Progress Notes (Signed)
CARDIAC REHAB PHASE I   PRE:  Rate/Rhythm: 70SR  BP:  Supine:   Sitting: 121/67  Standing:    SaO2:   MODE:  Ambulation: 680 ft   POST:  Rate/Rhythem: 99SR  BP:  Supine:   Sitting: 128/82  Standing:    SaO2:  0740-0835 Pt walked 680 ft on RA with steady gait. Denied chest pain. Tolerated well. Education completed with pt and wife. Declined CRP2.  Encouraged smoking cessation.  Duanne Limerick

## 2012-01-07 ENCOUNTER — Telehealth: Payer: Self-pay | Admitting: *Deleted

## 2012-01-07 ENCOUNTER — Encounter: Payer: Self-pay | Admitting: *Deleted

## 2012-01-07 NOTE — Telephone Encounter (Signed)
Pt's wife, Inetta Fermo, called stating they were told pt could return to work in 1 week from d/c date. This would be 2/21. However, they were not given a note with the date of 2/21 on it. Pt's work is requiring this for him to return. She would like to pick up in our office.

## 2012-01-07 NOTE — Telephone Encounter (Signed)
Spoke with Dr. Kirke Corin who confirms pt can return to work next Thursday w/o restrictions. Pt's wife is aware she can pick up a note in our office for pt to return to work.

## 2012-01-18 ENCOUNTER — Encounter: Payer: BC Managed Care – PPO | Admitting: Cardiovascular Disease

## 2012-01-24 ENCOUNTER — Ambulatory Visit (INDEPENDENT_AMBULATORY_CARE_PROVIDER_SITE_OTHER): Payer: BC Managed Care – PPO | Admitting: Cardiovascular Disease

## 2012-01-24 ENCOUNTER — Encounter: Payer: Self-pay | Admitting: Cardiovascular Disease

## 2012-01-24 DIAGNOSIS — I10 Essential (primary) hypertension: Secondary | ICD-10-CM

## 2012-01-24 DIAGNOSIS — E785 Hyperlipidemia, unspecified: Secondary | ICD-10-CM | POA: Insufficient documentation

## 2012-01-24 DIAGNOSIS — I2510 Atherosclerotic heart disease of native coronary artery without angina pectoris: Secondary | ICD-10-CM

## 2012-01-24 DIAGNOSIS — F17200 Nicotine dependence, unspecified, uncomplicated: Secondary | ICD-10-CM

## 2012-01-24 DIAGNOSIS — Z79899 Other long term (current) drug therapy: Secondary | ICD-10-CM

## 2012-01-24 DIAGNOSIS — Z720 Tobacco use: Secondary | ICD-10-CM

## 2012-01-24 MED ORDER — CLOPIDOGREL BISULFATE 75 MG PO TABS: 75.0000 mg | ORAL_TABLET | Freq: Every day | ORAL | Status: DC

## 2012-01-24 MED ORDER — ALPRAZOLAM 1 MG PO TABS: 1.0000 mg | ORAL_TABLET | Freq: Two times a day (BID) | ORAL | Status: AC | PRN

## 2012-01-24 MED ORDER — ALPRAZOLAM 1 MG PO TABS: 1.0000 mg | ORAL_TABLET | Freq: Two times a day (BID) | ORAL | Status: DC | PRN

## 2012-01-24 MED ORDER — ATORVASTATIN CALCIUM 40 MG PO TABS: 40.0000 mg | ORAL_TABLET | Freq: Every day | ORAL | Status: DC

## 2012-01-24 NOTE — Assessment & Plan Note (Signed)
I had a prolonged discussion with him about the importance of smoking cessation. He is interested but is having difficulty due to severe anxiety. He was able to quit in the past when he was given Xanax and was able to stay tobacco free for 3 years. Thus, I would give him Xanax 1 mg twice daily as needed for a short-term use. He was given 60 tablets with no refills. I explained to him that I would not be able to provide him with further refills and this is just a temporary measure to help him quit smoking. We also discussed different options of smoking cessation including medications. He seems to be mostly interested in nicotine replacement therapy with a patch at this time.

## 2012-01-24 NOTE — Assessment & Plan Note (Signed)
Lab Results  Component Value Date   CHOL 195 01/06/2012   HDL 52 01/06/2012   LDLCALC 113* 01/06/2012   TRIG 149 01/06/2012   CHOLHDL 3.8 01/06/2012   Most recent lipid profile was not at target. He has not started taking atorvastatin yet. I asked him to stop lovastatin and start taking atorvastatin 40 mg once daily. I recommend a target LDL of less than 70. I will request fasting lipid and liver profile in 6 weeks.

## 2012-01-24 NOTE — Assessment & Plan Note (Signed)
The patient had a recent non-ST elevation myocardial infarction. He underwent a successful drug-eluting stent placement to both OM 3 and mid circumflex. His RCA is occluded with left-to-right collaterals. Unfortunately, he is not able to afford Effient. Thus, I will switch him to Plavix 75 mg once daily after he finishes his current supplies. I had a prolonged discussion with him about the importance of lifestyle changes especially smoking cessation and increasing physical activities. I recommend continuing dual antiplatelet therapy for at least a year.

## 2012-01-24 NOTE — Assessment & Plan Note (Signed)
His blood pressure is well-controlled 

## 2012-01-24 NOTE — Progress Notes (Signed)
HPI  This is a 51 year old male who is here today for a followup visit. He has known history of coronary artery disease status post angioplasty in bare-metal stent placements to the LAD and RCA in 2000. The patient presented last month to [facility] with non-ST elevation myocardial infarction. He underwent cardiac catheterization which showed an occluded RCA at the previously placed stent with left-to-right collaterals. The LAD had 50% proximal stenosis. The stent in the mid segment was patent with minimal restenosis. The left circumflex had an ulcerated 80% stenosis in the midsegment as well as 80% stenosis in OM 3. The patient underwent an angioplasty and drug-eluting stent placement to both OM 3 and left circumflex. Ejection fraction was normal. He has been doing well since then. He denies any chest pain. He is trying to quit smoking at the suffering from severe anxiety. He was able to quit in the past for 3 years but he had to use Xanax initially. He is also under a lot of stress for financial reasons. His wife is sick. He works as a Data processing manager. He did not able to afford cardiac rehabilitation as he has a co-pay. He is also not able to afford Effient which has a $60 co-pay.  No Known Allergies   Current Outpatient Prescriptions on File Prior to Visit  Medication Sig Dispense Refill  . aspirin 81 MG chewable tablet Chew 1 tablet (81 mg total) by mouth daily.  30 tablet    . lisinopril (PRINIVIL,ZESTRIL) 10 MG tablet Take 10 mg by mouth daily.      . metoprolol tartrate (LOPRESSOR) 25 MG tablet Take 1 tablet (25 mg total) by mouth 2 (two) times daily.  60 tablet  3  . nitroGLYCERIN (NITROSTAT) 0.4 MG SL tablet Place 1 tablet (0.4 mg total) under the tongue every 5 (five) minutes x 3 doses as needed for chest pain.  25 tablet  3     Past Medical History  Diagnosis Date  . Angina   . Headache   . Tobacco use   . Alcohol abuse, episodic     8 beers per weekend  . Opioid  dependence   . CAD (coronary artery disease)     cath 2000. Occluded LAD and significant RCA stenosis. BMS to LAD and RCA. NSTEMI in 12/2011: cath showed occluded RCA stent, 50% proximal LAD stenosis, patent stent, 80% mid LCX and 80% OM3. Had DES placement to both OM3 and LCX.   Marland Kitchen Hyperlipidemia   . Hypertension      Past Surgical History  Procedure Date  . Appendectomy   . Hernia repair   . Coronary angioplasty      Family History  Problem Relation Age of Onset  . Cancer Mother   . Coronary artery disease Father   . Hypertension Father   . Hyperlipidemia Father   . Heart disease Father   . Heart attack Father   . Diabetes type II Father      History   Social History  . Marital Status: Married    Spouse Name: N/A    Number of Children: N/A  . Years of Education: N/A   Occupational History  . Not on file.   Social History Main Topics  . Smoking status: Current Everyday Smoker -- 1.2 packs/day for 35 years    Types: Cigarettes  . Smokeless tobacco: Never Used  . Alcohol Use: 4.8 oz/week    8 Cans of beer per week  . Drug Use: 6  per week    Special: Hydromorphone  . Sexually Active: Yes   Other Topics Concern  . Not on file   Social History Narrative  . No narrative on file     PHYSICAL EXAM   BP 121/85  Pulse 101  Ht 5\' 10"  (1.778 m)  Wt 169 lb (76.658 kg)  BMI 24.25 kg/m2  Constitutional: He is oriented to person, place, and time. He appears well-developed and well-nourished. No distress.  HENT: No nasal discharge.  Head: Normocephalic and atraumatic.  Eyes: Pupils are equal and round. Right eye exhibits no discharge. Left eye exhibits no discharge.  Neck: Normal range of motion. Neck supple. No JVD present. No thyromegaly present.  Cardiovascular: Normal rate, regular rhythm, normal heart sounds and. Exam reveals no gallop and no friction rub. No murmur heard.  Pulmonary/Chest: Effort normal and breath sounds normal. No stridor. No respiratory  distress. He has no wheezes. He has no rales. He exhibits no tenderness.  Abdominal: Soft. Bowel sounds are normal. He exhibits no distension. There is no tenderness. There is no rebound and no guarding.  Musculoskeletal: Normal range of motion. He exhibits no edema and no tenderness.  Neurological: He is alert and oriented to person, place, and time. Coordination normal.  Skin: Skin is warm and dry. No rash noted. He is not diaphoretic. No erythema. No pallor.  Psychiatric: He has a normal mood and affect. His behavior is normal. Judgment and thought content normal.  Right groin: No hematoma or bruit. Right radial pulse is intact.      ASSESSMENT AND PLAN

## 2012-01-24 NOTE — Patient Instructions (Addendum)
Your physician wants you to follow-up in: 4 months. You will receive a reminder letter in the mail one-two months in advance. If you don't receive a letter, please call our office to schedule the follow-up appointment. Stop Effient (when you finish current supply). Stop Lovastatin. Start Plavix 75 mg daily (Do not start until you stop Effient) and start Lipitor (atorvastatin) 40 mg Take 1 tablet by mouth every night.  Xanax 1 mg two times a day as needed for smoking. (No refills.) Your physician recommends that you go to the [facility] for a FASTING lipid profile and liver function labs. Do not eat or drink after midnight.  DO IN 6 WEEKS.

## 2012-03-09 ENCOUNTER — Encounter: Payer: Self-pay | Admitting: *Deleted

## 2012-03-09 ENCOUNTER — Telehealth: Payer: Self-pay | Admitting: *Deleted

## 2012-03-09 NOTE — Telephone Encounter (Signed)
Message copied by Arlyss Gandy on Thu Mar 09, 2012  3:04 PM ------      Message from: Lorine Bears A      Created: Wed Mar 08, 2012  2:08 PM       Inform patient that his cholesterol and triglyceride were very high. Is he really taking Atorvastatin everyday?      He needs to improve his diet, cut down alcohol intake and exercise regularly.

## 2012-03-09 NOTE — Telephone Encounter (Signed)
Pt's home number is not a working number. Letter will be mailed asking for a return call.

## 2012-03-14 ENCOUNTER — Encounter: Payer: Self-pay | Admitting: *Deleted

## 2012-03-16 NOTE — Telephone Encounter (Signed)
Repeat fasting lipid profile in 2 months.

## 2012-03-16 NOTE — Telephone Encounter (Signed)
Pt states he is taking atorvastatin as prescribed. He will work on diet, alcohol intake and exercise.

## 2012-03-17 NOTE — Telephone Encounter (Signed)
Left message to notify pt to continue Lipitor as prescribed. We will recheck labs in 2 months and a reminder letter will be mailed at this time.

## 2012-05-16 ENCOUNTER — Other Ambulatory Visit (HOSPITAL_COMMUNITY): Payer: Self-pay | Admitting: Physician Assistant

## 2012-05-24 ENCOUNTER — Telehealth: Payer: Self-pay | Admitting: *Deleted

## 2012-05-24 DIAGNOSIS — E785 Hyperlipidemia, unspecified: Secondary | ICD-10-CM

## 2012-05-24 DIAGNOSIS — Z79899 Other long term (current) drug therapy: Secondary | ICD-10-CM

## 2012-05-24 NOTE — Telephone Encounter (Signed)
Message copied by Eustace Moore on Wed May 24, 2012  1:22 PM ------      Message from: Arlyss Gandy      Created: Fri Mar 17, 2012 10:13 AM      Regarding: needs reminder letter for labs       Pt due for FLP/LFT (per April telephone note) 272.4, V58.69

## 2012-05-24 NOTE — Telephone Encounter (Signed)
Spoke with patient and he was having trouble hearing nurse and said he would call office right back on land line.

## 2012-05-24 NOTE — Telephone Encounter (Signed)
Patient informed the fasting lab work needed and order faxed to [facility] lab.

## 2012-06-26 ENCOUNTER — Encounter: Payer: BC Managed Care – PPO | Admitting: Cardiology

## 2012-07-05 NOTE — Telephone Encounter (Signed)
Spoke with patient today r/e labs not being done and also cancelling appt on 08/05. Patient states he is on vacation now and will have lab work done next week. Patient states he will call office to reschedule appointment after he sees his work schedule next week. Nurse advised patient that if lab work isn't done by 08/30, another call to him would take place. Patient verbalized understanding of plan.

## 2012-09-08 ENCOUNTER — Telehealth: Payer: Self-pay | Admitting: *Deleted

## 2012-09-08 NOTE — Telephone Encounter (Signed)
Patient informed and said he just restarted his cholesterol medications and that he was off of them times >1 month. Patient given f/u appointment in November.

## 2012-09-08 NOTE — Telephone Encounter (Signed)
Message copied by Eustace Moore on Fri Sep 08, 2012  3:02 PM ------      Message from: Lorine Bears A      Created: Mon Sep 04, 2012 10:50 AM       His cholesterol and triglycerides are very high. Is he taking atorvastatin or not? He needs to come for a followup visit.

## 2012-10-17 ENCOUNTER — Ambulatory Visit (INDEPENDENT_AMBULATORY_CARE_PROVIDER_SITE_OTHER): Payer: BC Managed Care – PPO | Admitting: Cardiovascular Disease

## 2012-10-17 ENCOUNTER — Encounter: Payer: Self-pay | Admitting: Cardiovascular Disease

## 2012-10-17 VITALS — BP 144/99 | Ht 70.0 in | Wt 172.0 lb

## 2012-10-17 DIAGNOSIS — Z720 Tobacco use: Secondary | ICD-10-CM

## 2012-10-17 DIAGNOSIS — Z79899 Other long term (current) drug therapy: Secondary | ICD-10-CM

## 2012-10-17 DIAGNOSIS — F17200 Nicotine dependence, unspecified, uncomplicated: Secondary | ICD-10-CM

## 2012-10-17 DIAGNOSIS — I2510 Atherosclerotic heart disease of native coronary artery without angina pectoris: Secondary | ICD-10-CM

## 2012-10-17 DIAGNOSIS — I10 Essential (primary) hypertension: Secondary | ICD-10-CM

## 2012-10-17 DIAGNOSIS — E785 Hyperlipidemia, unspecified: Secondary | ICD-10-CM

## 2012-10-17 MED ORDER — LISINOPRIL 20 MG PO TABS: 20.0000 mg | ORAL_TABLET | Freq: Every day | ORAL | Status: DC

## 2012-10-17 MED ORDER — METOPROLOL TARTRATE 25 MG PO TABS: 25.0000 mg | ORAL_TABLET | Freq: Two times a day (BID) | ORAL | Status: DC

## 2012-10-17 MED ORDER — CLOPIDOGREL BISULFATE 75 MG PO TABS: 75.0000 mg | ORAL_TABLET | Freq: Every day | ORAL | Status: DC

## 2012-10-17 NOTE — Assessment & Plan Note (Signed)
The patient resumed taking atorvastatin recently. I will request a followup lipid and liver profile.

## 2012-10-17 NOTE — Assessment & Plan Note (Signed)
I discussed with him the importance of smoking cessation. 

## 2012-10-17 NOTE — Progress Notes (Signed)
HPI  This is a 51 year old male who is here today for a followup visit. He has known history of coronary artery disease status post angioplasty in bare-metal stent placements to the LAD and RCA in 2000. The patient presented in February of this year to [facility] with non-ST elevation myocardial infarction. He underwent cardiac catheterization which showed an occluded RCA at the previously placed stent with left-to-right collaterals. The LAD had 50% proximal stenosis. The stent in the mid segment was patent with minimal restenosis. The left circumflex had an ulcerated 80% stenosis in the midsegment as well as 80% stenosis in OM 3. The patient underwent an angioplasty and drug-eluting stent placement to both OM 3 and left circumflex. Ejection fraction was normal. He has been doing well since then.  The patient missed a previously scheduled appointment and that has been compliance issues with medications. For some unclear reasons, he stopped taking Plavix on his own. He also was not taking atorvastatin on a regular basis. He denies any chest pain or dyspnea. He continues to smoke but is trying to quit with the help of e-cigarettes.  No Known Allergies   Current Outpatient Prescriptions on File Prior to Visit  Medication Sig Dispense Refill  . aspirin 81 MG chewable tablet Chew 1 tablet (81 mg total) by mouth daily.  30 tablet    . atorvastatin (LIPITOR) 40 MG tablet Take 1 tablet (40 mg total) by mouth daily.  30 tablet  6  . nitroGLYCERIN (NITROSTAT) 0.4 MG SL tablet Place 1 tablet (0.4 mg total) under the tongue every 5 (five) minutes x 3 doses as needed for chest pain.  25 tablet  3  . [DISCONTINUED] metoprolol tartrate (LOPRESSOR) 25 MG tablet TAKE ONE TABLET BY MOUTH TWICE DAILY  60 tablet  2     Past Medical History  Diagnosis Date  . Angina   . Headache   . Tobacco use   . Alcohol abuse, episodic     8 beers per weekend  . Opioid dependence   . CAD (coronary artery  disease)     cath 2000. Occluded LAD and significant RCA stenosis. BMS to LAD and RCA. NSTEMI in 12/2011: cath showed occluded RCA stent, 50% proximal LAD stenosis, patent stent, 80% mid LCX and 80% OM3. Had DES placement to both OM3 and LCX.   Marland Kitchen Hyperlipidemia   . Hypertension      Past Surgical History  Procedure Date  . Appendectomy   . Hernia repair   . Coronary angioplasty      Family History  Problem Relation Age of Onset  . Cancer Mother   . Coronary artery disease Father   . Hypertension Father   . Hyperlipidemia Father   . Heart disease Father   . Heart attack Father   . Diabetes type II Father      History   Social History  . Marital Status: Married    Spouse Name: N/A    Number of Children: N/A  . Years of Education: N/A   Occupational History  . Not on file.   Social History Main Topics  . Smoking status: Current Every Day Smoker -- 1.2 packs/day for 35 years    Types: Cigarettes  . Smokeless tobacco: Never Used     Comment: electronic cigarette  . Alcohol Use: 4.8 oz/week    8 Cans of beer per week  . Drug Use: 6 per week    Special: Hydromorphone  . Sexually Active:  Yes   Other Topics Concern  . Not on file   Social History Narrative  . No narrative on file     PHYSICAL EXAM   BP 144/99  Ht 5\' 10"  (1.778 m)  Wt 172 lb (78.019 kg)  BMI 24.68 kg/m2 Constitutional: He is oriented to person, place, and time. He appears well-developed and well-nourished. No distress.  HENT: No nasal discharge.  Head: Normocephalic and atraumatic.  Eyes: Pupils are equal and round. Right eye exhibits no discharge. Left eye exhibits no discharge.  Neck: Normal range of motion. Neck supple. No JVD present. No thyromegaly present.  Cardiovascular: Normal rate, regular rhythm, normal heart sounds and. Exam reveals no gallop and no friction rub. No murmur heard.  Pulmonary/Chest: Effort normal and breath sounds normal. No stridor. No respiratory distress. He  has no wheezes. He has no rales. He exhibits no tenderness.  Abdominal: Soft. Bowel sounds are normal. He exhibits no distension. There is no tenderness. There is no rebound and no guarding.  Musculoskeletal: Normal range of motion. He exhibits no edema and no tenderness.  Neurological: He is alert and oriented to person, place, and time. Coordination normal.  Skin: Skin is warm and dry. No rash noted. He is not diaphoretic. No erythema. No pallor.  Psychiatric: He has a normal mood and affect. His behavior is normal. Judgment and thought content normal.      ASSESSMENT AND PLAN

## 2012-10-17 NOTE — Patient Instructions (Addendum)
Resume Plavix 75 mg once daily.  Increase Lisinopril to 20 mg once daily.  Continue other medications.  Fasting Lipid and liver profile in 2 weeks.  Follow up in 6 months.

## 2012-10-17 NOTE — Assessment & Plan Note (Signed)
His blood pressure is elevated. I will increase lisinopril to 20 mg daily.

## 2012-10-17 NOTE — Assessment & Plan Note (Signed)
The patient is overall stable from a cardiac standpoint. I had a prolonged discussion with him about my concerns of his poor adherence to medications especially Plavix. I explained to him the risk of stent thrombosis. He is to resume Plavix 75 mg once daily which should be continued for a minimum of one year until February of 2014 but preferably longer given his multiple stents.

## 2013-09-06 ENCOUNTER — Encounter: Payer: Self-pay | Admitting: Cardiovascular Disease

## 2013-09-06 ENCOUNTER — Ambulatory Visit (INDEPENDENT_AMBULATORY_CARE_PROVIDER_SITE_OTHER): Payer: PRIVATE HEALTH INSURANCE | Admitting: Cardiovascular Disease

## 2013-09-06 VITALS — BP 137/90 | HR 98 | Ht 70.0 in | Wt 172.0 lb

## 2013-09-06 DIAGNOSIS — F411 Generalized anxiety disorder: Secondary | ICD-10-CM

## 2013-09-06 DIAGNOSIS — E785 Hyperlipidemia, unspecified: Secondary | ICD-10-CM

## 2013-09-06 DIAGNOSIS — F419 Anxiety disorder, unspecified: Secondary | ICD-10-CM

## 2013-09-06 DIAGNOSIS — Z79899 Other long term (current) drug therapy: Secondary | ICD-10-CM

## 2013-09-06 DIAGNOSIS — I10 Essential (primary) hypertension: Secondary | ICD-10-CM

## 2013-09-06 DIAGNOSIS — I2510 Atherosclerotic heart disease of native coronary artery without angina pectoris: Secondary | ICD-10-CM

## 2013-09-06 MED ORDER — NITROGLYCERIN 0.4 MG SL SUBL: 0.4000 mg | SUBLINGUAL_TABLET | SUBLINGUAL | Status: DC | PRN

## 2013-09-06 MED ORDER — ATORVASTATIN CALCIUM 40 MG PO TABS: 40.0000 mg | ORAL_TABLET | Freq: Every evening | ORAL | Status: DC

## 2013-09-06 MED ORDER — METOPROLOL TARTRATE 25 MG PO TABS: 25.0000 mg | ORAL_TABLET | Freq: Two times a day (BID) | ORAL | Status: DC

## 2013-09-06 MED ORDER — CLOPIDOGREL BISULFATE 75 MG PO TABS: 75.0000 mg | ORAL_TABLET | Freq: Every day | ORAL | Status: DC

## 2013-09-06 MED ORDER — ASPIRIN EC 81 MG PO TBEC: 81.0000 mg | DELAYED_RELEASE_TABLET | Freq: Every day | ORAL | Status: DC

## 2013-09-06 MED ORDER — LISINOPRIL 20 MG PO TABS: 20.0000 mg | ORAL_TABLET | Freq: Every day | ORAL | Status: DC

## 2013-09-06 NOTE — Patient Instructions (Addendum)
Continue all medications.   Refills for all medications sent to pharmacy Contact PMD Adrian Rivera) regarding anxiety & request for Xanax  Your physician wants you to follow up in: 6 months.  You will receive a reminder letter in the mail one-two months in advance.  If you don't receive a letter, please call our office to schedule the follow up appointment

## 2013-09-06 NOTE — Progress Notes (Signed)
Patient ID: Adrian Rivera, male   DOB: 05-05-1961, 52 y.o.   MRN: [id number]      SUBJECTIVE: Adrian Rivera is a 52 year old male who is here today for a followup visit. He has a known history of coronary artery disease status post angioplasty and bare-metal stent placement to the LAD and RCA in 2000.  He presented in February 2013 to [facility] with a non-ST elevation myocardial infarction. He underwent cardiac catheterization which showed an occluded RCA at the previously placed stent with left-to-right collaterals. The LAD had 50% proximal stenosis. The stent in the mid segment was patent with minimal restenosis. The left circumflex had an ulcerated 80% stenosis in the midsegment as well as 80% stenosis in OM 3. The patient underwent angioplasty and drug-eluting stent placement to both OM 3 and left circumflex. Ejection fraction was normal.   There have been issues with medication compliance in the past. He tells me his meds ran out a month ago and he hasn't refilled them as he's been unable to afford them, due to his wife's medical bills. He says his wife has MS, bipolar disorder, and is manic depressive. He works as a Curator and says he is very stressed out due to both work and home issues.  He requests Xanax, stating Dr. Kirke Corin provided it for him in the past and "it felt like the weight of the world was off his shoulders". I reviewed the office visits from 01/2012 and while Dr. Kirke Corin provided him with 60 tablets and no refills, he informed the patient at that time that our office would be unable to provide further refills of this medication.   He denies chest pain, palpitations, and shortness of breath.   No Known Allergies  Current Outpatient Prescriptions  Medication Sig Dispense Refill  . atorvastatin (LIPITOR) 40 MG tablet Take 40 mg by mouth daily.      . clopidogrel (PLAVIX) 75 MG tablet Take 1 tablet (75 mg total) by mouth daily.  30 tablet  6  . nitroGLYCERIN (NITROSTAT)  0.4 MG SL tablet Place 0.4 mg under the tongue every 5 (five) minutes x 3 doses as needed for chest pain.      Marland Kitchen lisinopril (PRINIVIL,ZESTRIL) 20 MG tablet Take 1 tablet (20 mg total) by mouth daily.  30 tablet  6  . metoprolol tartrate (LOPRESSOR) 25 MG tablet Take 1 tablet (25 mg total) by mouth 2 (two) times daily.  60 tablet  6   No current facility-administered medications for this visit.    Past Medical History  Diagnosis Date  . Angina   . Headache(784.0)   . Tobacco use   . Alcohol abuse, episodic     8 beers per weekend  . Opioid dependence   . CAD (coronary artery disease)     cath 2000. Occluded LAD and significant RCA stenosis. BMS to LAD and RCA. NSTEMI in 12/2011: cath showed occluded RCA stent, 50% proximal LAD stenosis, patent stent, 80% mid LCX and 80% OM3. Had DES placement to both OM3 and LCX.   Marland Kitchen Hyperlipidemia   . Hypertension     Past Surgical History  Procedure Laterality Date  . Appendectomy    . Hernia repair    . Coronary angioplasty      History   Social History  . Marital Status: Married    Spouse Name: N/A    Number of Children: N/A  . Years of Education: N/A   Occupational History  . Not on file.  Social History Main Topics  . Smoking status: Current Every Day Smoker -- 1.25 packs/day for 35 years    Types: Cigarettes  . Smokeless tobacco: Never Used     Comment: electronic cigarette  . Alcohol Use: 4.8 oz/week    8 Cans of beer per week  . Drug Use: 6.00 per week    Special: Hydromorphone  . Sexual Activity: Yes   Other Topics Concern  . Not on file   Social History Narrative  . No narrative on file     Filed Vitals:   09/06/13 1432  BP: 137/90  Pulse: 98  Height: 5\' 10"  (1.778 m)  Weight: 172 lb (78.019 kg)    PHYSICAL EXAM General: NAD Neck: No JVD, no thyromegaly or thyroid nodule.  Lungs: Clear to auscultation bilaterally with normal respiratory effort. CV: Nondisplaced PMI.  Heart regular S1/S2, no S3/S4, no  murmur.  No peripheral edema.  No carotid bruit.  Normal pedal pulses.  Abdomen: Soft, nontender, no hepatosplenomegaly, no distention.  Neurologic: Alert and oriented x 3.  Psych: Normal affect. Extremities: No clubbing or cyanosis.   ECG: reviewed and available in electronic records. (NSR, LAFB, 91 bpm)      ASSESSMENT AND PLAN: 1. CAD: symptomatically stable. Will refill all cardiovascular meds, including Lipitor, Plavix, lisinpril, and metoprolol. He has been taking ASA 81 mg daily, and I recommended he continue this. 2. HTN: borderline without therapy, and will restart meds as above. 3. Hyperlipidemia: restart Lipitor. 4. Stress: I advised him to speak with his PCP regarding stress, anxiety, and depression. I did not prescribe Xanax. I reviewed the office visits from 01/2012 and while Dr. Kirke Corin provided him with 60 tablets and no refills, he informed the patient at that time that our office would be unable to provide further refills of this medication.   Prentice Docker, M.D., F.A.C.C.

## 2014-08-15 ENCOUNTER — Encounter: Payer: Self-pay | Admitting: Cardiovascular Disease

## 2014-09-09 ENCOUNTER — Other Ambulatory Visit: Payer: Self-pay | Admitting: *Deleted

## 2014-09-09 MED ORDER — CLOPIDOGREL BISULFATE 75 MG PO TABS: 75.0000 mg | ORAL_TABLET | Freq: Every day | ORAL | Status: DC

## 2014-09-09 MED ORDER — METOPROLOL TARTRATE 25 MG PO TABS: 25.0000 mg | ORAL_TABLET | Freq: Two times a day (BID) | ORAL | Status: DC

## 2014-09-09 MED ORDER — ATORVASTATIN CALCIUM 40 MG PO TABS
40.0000 mg | ORAL_TABLET | Freq: Every evening | ORAL | Status: DC
Start: 2014-09-09 — End: 2014-12-12

## 2014-09-09 MED ORDER — LISINOPRIL 20 MG PO TABS: 20.0000 mg | ORAL_TABLET | Freq: Every day | ORAL | Status: DC

## 2014-10-31 ENCOUNTER — Encounter (HOSPITAL_COMMUNITY): Payer: Self-pay | Admitting: Cardiovascular Disease

## 2014-12-12 ENCOUNTER — Encounter: Payer: Self-pay | Admitting: Cardiovascular Disease

## 2014-12-12 ENCOUNTER — Ambulatory Visit (INDEPENDENT_AMBULATORY_CARE_PROVIDER_SITE_OTHER): Payer: 59 | Admitting: Cardiovascular Disease

## 2014-12-12 VITALS — BP 155/96 | HR 86 | Ht 70.0 in | Wt 171.0 lb

## 2014-12-12 DIAGNOSIS — I10 Essential (primary) hypertension: Secondary | ICD-10-CM

## 2014-12-12 DIAGNOSIS — F419 Anxiety disorder, unspecified: Secondary | ICD-10-CM

## 2014-12-12 DIAGNOSIS — I2510 Atherosclerotic heart disease of native coronary artery without angina pectoris: Secondary | ICD-10-CM

## 2014-12-12 DIAGNOSIS — E785 Hyperlipidemia, unspecified: Secondary | ICD-10-CM

## 2014-12-12 MED ORDER — LISINOPRIL 20 MG PO TABS: 20.0000 mg | ORAL_TABLET | Freq: Every day | ORAL | Status: DC

## 2014-12-12 MED ORDER — CLOPIDOGREL BISULFATE 75 MG PO TABS: 75.0000 mg | ORAL_TABLET | Freq: Every day | ORAL | Status: DC

## 2014-12-12 MED ORDER — METOPROLOL TARTRATE 25 MG PO TABS: 25.0000 mg | ORAL_TABLET | Freq: Two times a day (BID) | ORAL | Status: DC

## 2014-12-12 MED ORDER — ATORVASTATIN CALCIUM 40 MG PO TABS: 40.0000 mg | ORAL_TABLET | Freq: Every evening | ORAL | Status: DC

## 2014-12-12 MED ORDER — ASPIRIN EC 81 MG PO TBEC: 81.0000 mg | DELAYED_RELEASE_TABLET | Freq: Every day | ORAL | Status: DC

## 2014-12-12 MED ORDER — NITROGLYCERIN 0.4 MG SL SUBL: 0.4000 mg | SUBLINGUAL_TABLET | SUBLINGUAL | Status: DC | PRN

## 2014-12-12 NOTE — Patient Instructions (Signed)
Your physician wants you to follow-up in: 6 months with Dr. Virgina Jock will receive a reminder letter in the mail two months in advance. If you don't receive a letter, please call our office to schedule the follow-up appointment.  Your physician recommends that you continue on your current medications as directed. Please refer to the Current Medication list given to you today.  WE HAVE GIVEN YOU 1 YEAR OF REFILLS ON CARDIAC MEDCIATIONS  Thank you for choosing [facility]!!

## 2014-12-12 NOTE — Progress Notes (Signed)
Patient ID: Adrian Rivera, male   DOB: 1961/10/19, 54 y.o.   MRN: [id number]      SUBJECTIVE: [name] presents for a followup visit. He has a known history of coronary artery disease status post angioplasty and bare-metal stent placement to the LAD and RCA in 2000.  He then presented in February 2013 to [facility] with a non-ST elevation myocardial infarction. He underwent cardiac catheterization which showed an occluded RCA at the previously placed stent with left-to-right collaterals. The LAD had 50% proximal stenosis. The stent in the mid segment was patent with minimal restenosis. The left circumflex had an ulcerated 80% stenosis in the midsegment as well as 80% stenosis in OM 3. The patient underwent angioplasty and drug-eluting stent placement to both OM 3 and left circumflex. Ejection fraction was normal.  There have been issues with medication compliance in the past, as well as requesting Xanax.  He is here with his wife who has multiple sclerosis and their 61 yr old daughter, Adrian Rivera.  He denies chest pain and shortness of breath. He just got off of work and he feels very anxious. He has been out of his Lipitor and lisinopril for at least 2 weeks. He has not had use of the nitroglycerin in the past year.     Review of Systems: As per "subjective", otherwise negative.  No Known Allergies  Current Outpatient Prescriptions  Medication Sig Dispense Refill  . aspirin EC 81 MG tablet Take 1 tablet (81 mg total) by mouth daily.    Marland Kitchen atorvastatin (LIPITOR) 40 MG tablet Take 1 tablet (40 mg total) by mouth every evening. 30 tablet 2  . clopidogrel (PLAVIX) 75 MG tablet Take 1 tablet (75 mg total) by mouth daily. 30 tablet 2  . lisinopril (PRINIVIL,ZESTRIL) 20 MG tablet Take 1 tablet (20 mg total) by mouth daily. 30 tablet 2  . metoprolol tartrate (LOPRESSOR) 25 MG tablet Take 1 tablet (25 mg total) by mouth 2 (two) times daily. 60 tablet 2  . nitroGLYCERIN (NITROSTAT) 0.4  MG SL tablet Place 1 tablet (0.4 mg total) under the tongue every 5 (five) minutes x 3 doses as needed for chest pain. 25 tablet 3  . Omega-3 Fatty Acids (FISH OIL) 1000 MG CAPS Take 1 capsule by mouth daily.     No current facility-administered medications for this visit.    Past Medical History  Diagnosis Date  . Angina   . Headache(784.0)   . Tobacco use   . Alcohol abuse, episodic     8 beers per weekend  . Opioid dependence   . CAD (coronary artery disease)     cath 2000. Occluded LAD and significant RCA stenosis. BMS to LAD and RCA. NSTEMI in 12/2011: cath showed occluded RCA stent, 50% proximal LAD stenosis, patent stent, 80% mid LCX and 80% OM3. Had DES placement to both OM3 and LCX.   Marland Kitchen Hyperlipidemia   . Hypertension     Past Surgical History  Procedure Laterality Date  . Appendectomy    . Hernia repair    . Coronary angioplasty    . Left heart catheterization with coronary angiogram N/A 01/05/2012    Procedure: LEFT HEART CATHETERIZATION WITH CORONARY ANGIOGRAM;  Surgeon: Burnell Blanks, MD;  [facility] C[facility] LAB;  Service: Cardiovascular;  Laterality: N/A;  . Percutaneous coronary stent intervention (pci-s)  01/05/2012    Procedure: PERCUTANEOUS CORONARY STENT INTERVENTION (PCI-S);  Surgeon: Burnell Blanks, MD;  Location: [facility] CATH LAB;  Service: Cardiovascular;;    History   Social History  . Marital Status: Married    Spouse Name: N/A    Number of Children: N/A  . Years of Education: N/A   Occupational History  . Not on file.   Social History Main Topics  . Smoking status: Current Every Day Smoker -- 1.25 packs/day for 35 years    Types: Cigarettes    Start date: 06/05/1976  . Smokeless tobacco: Never Used     Comment: electronic cigarette  . Alcohol Use: 4.8 oz/week    8 Cans of beer per week  . Drug Use: 6.00 per week    Special: Hydromorphone  . Sexual Activity: Yes   Other Topics Concern  . Not on file   Social History Narrative       Filed Vitals:   12/12/14 1537  Height: 5\' 10"  (1.778 m)  Weight: 171 lb (77.565 kg)   BP 155/96  Pulse 86    PHYSICAL EXAM General: NAD HEENT: Normal. Neck: No JVD, no thyromegaly. Lungs: Clear to auscultation bilaterally with normal respiratory effort. CV: Nondisplaced PMI.  Regular rate and rhythm, normal S1/S2, no S3/S4, no murmur. No pretibial or periankle edema.  No carotid bruit.  Normal pedal pulses.  Abdomen: Soft, nontender, no hepatosplenomegaly, no distention.  Neurologic: Alert and oriented x 3.  Psych: Normal affect. Skin: Normal. Musculoskeletal: Normal range of motion, no gross deformities. Extremities: No clubbing or cyanosis.   ECG: Most recent ECG reviewed.      ASSESSMENT AND PLAN: 1. CAD: Symptomatically stable. Continue Lipitor, Plavix, lisinpril, ASA, and metoprolol and I will provide a one year supply of each. 2. Essential HTN: Markedly elevated. Will refill lisinopril 3. Hyperlipidemia: Check lipids/LFT's if not done by PCP. Continue Lipitor.  Dispo: f/u 6 months.  Kate Sable, M.D., F.A.C.C.

## 2014-12-13 ENCOUNTER — Ambulatory Visit: Payer: PRIVATE HEALTH INSURANCE | Admitting: Cardiovascular Disease

## 2015-07-18 ENCOUNTER — Ambulatory Visit: Payer: 59 | Admitting: Cardiovascular Disease

## 2015-09-02 ENCOUNTER — Ambulatory Visit (INDEPENDENT_AMBULATORY_CARE_PROVIDER_SITE_OTHER): Payer: 59 | Admitting: Cardiovascular Disease

## 2015-09-02 ENCOUNTER — Encounter: Payer: Self-pay | Admitting: Cardiovascular Disease

## 2015-09-02 VITALS — BP 132/98 | HR 86 | Ht 70.0 in | Wt 174.0 lb

## 2015-09-02 DIAGNOSIS — F419 Anxiety disorder, unspecified: Secondary | ICD-10-CM | POA: Diagnosis not present

## 2015-09-02 DIAGNOSIS — I2510 Atherosclerotic heart disease of native coronary artery without angina pectoris: Secondary | ICD-10-CM | POA: Diagnosis not present

## 2015-09-02 DIAGNOSIS — I10 Essential (primary) hypertension: Secondary | ICD-10-CM

## 2015-09-02 DIAGNOSIS — E785 Hyperlipidemia, unspecified: Secondary | ICD-10-CM | POA: Diagnosis not present

## 2015-09-02 NOTE — Patient Instructions (Signed)
   Lab for Lipids - Reminder:  Nothing to eat or drink after 12 midnight prior to labs. - order given today. Office will contact with results via phone or letter.   Your physician wants you to follow up in:  1 year.  You will receive a reminder letter in the mail one-two months in advance.  If you don't receive a letter, please call our office to schedule the follow up appointment

## 2015-09-02 NOTE — Progress Notes (Signed)
Patient ID: Adrian Rivera, male   DOB: 1961/04/24, 54 y.o.   MRN: [id number]      SUBJECTIVE: Adrian Rivera presents for a followup visit. He has a known history of coronary artery disease status post angioplasty and bare-metal stent placement to the LAD and RCA in 2000.  He then presented in February 2013 to [facility] with a non-ST elevation myocardial infarction. He underwent cardiac catheterization which showed an occluded RCA at the previously placed stent with left-to-right collaterals. The LAD had 50% proximal stenosis. The stent in the mid segment was patent with minimal restenosis. The left circumflex had an ulcerated 80% stenosis in the midsegment as well as 80% stenosis in OM 3. The patient underwent angioplasty and drug-eluting stent placement to both OM 3 and left circumflex. Ejection fraction was normal.  There have been issues with medication compliance in the past, as well as requesting Xanax.  He denies chest pain and shortness of breath. Says he has a high level of anxiety.  Walks about 5 miles daily for work. 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  ECG performed in the office today demonstrates normal sinus rhythm with no ischemic ST segment or T-wave abnormalities, nor any arrhythmias.   Soc: His wife has multiple sclerosis. They have a 62 yr old daughter, Adrian Rivera.   Review of Systems: As per "subjective", otherwise negative.  No Known Allergies  Current Outpatient Prescriptions  Medication Sig Dispense Refill  . aspirin EC 81 MG tablet Take 1 tablet (81 mg total) by mouth daily. 30 tablet 11  . atorvastatin (LIPITOR) 40 MG tablet Take 1 tablet (40 mg total) by mouth every evening. 30 tablet 11  . clopidogrel (PLAVIX) 75 MG tablet Take 1 tablet (75 mg total) by mouth daily. 30 tablet 11  . lisinopril (PRINIVIL,ZESTRIL) 20 MG tablet Take 1 tablet (20 mg total) by mouth daily. 30 tablet 11  . metoprolol tartrate (LOPRESSOR) 25 MG tablet Take 1  tablet (25 mg total) by mouth 2 (two) times daily. 60 tablet 11  . nitroGLYCERIN (NITROSTAT) 0.4 MG SL tablet Place 1 tablet (0.4 mg total) under the tongue every 5 (five) minutes x 3 doses as needed for chest pain. 25 tablet 3  . Omega-3 Fatty Acids (FISH OIL) 1000 MG CAPS Take 1 capsule by mouth daily.     No current facility-administered medications for this visit.    Past Medical History  Diagnosis Date  . Angina   . Headache(784.0)   . Tobacco use   . Alcohol abuse, episodic     8 beers per weekend  . Opioid dependence ([place])   . CAD (coronary artery disease)     cath 2000. Occluded LAD and significant RCA stenosis. BMS to LAD and RCA. NSTEMI in 12/2011: cath showed occluded RCA stent, 50% proximal LAD stenosis, patent stent, 80% mid LCX and 80% OM3. Had DES placement to both OM3 and LCX.   Marland Kitchen Hyperlipidemia   . Hypertension     Past Surgical History  Procedure Laterality Date  . Appendectomy    . Hernia repair    . Coronary angioplasty    . Left heart catheterization with coronary angiogram N/A 01/05/2012    Procedure: LEFT HEART CATHETERIZATION WITH CORONARY ANGIOGRAM;  Surgeon: Burnell Blanks, MD;  Location: [facility] CATH LAB;  Service: Cardiovascular;  Laterality: N/A;  . Percutaneous coronary stent intervention (pci-s)  01/05/2012    Procedure: PERCUTANEOUS CORONARY STENT INTERVENTION (PCI-S);  Surgeon: Burnell Blanks, MD;  Location: [facility] CATH LAB;  Service: Cardiovascular;;    Social History   Social History  . Marital Status: Married    Spouse Name: N/A  . Number of Children: N/A  . Years of Education: N/A   Occupational History  . Not on file.   Social History Main Topics  . Smoking status: Current Every Day Smoker -- 1.25 packs/day for 35 years    Types: Cigarettes    Start date: 06/05/1976  . Smokeless tobacco: Never Used     Comment: electronic cigarette  . Alcohol Use: 4.8 oz/week    8 Cans of beer per week  . Drug Use: 6.00 per week     Special: Hydromorphone  . Sexual Activity: Yes   Other Topics Concern  . Not on file   Social History Narrative     Filed Vitals:   09/02/15 1547  BP: 132/98  Pulse: 86  Height: 5\' 10"  (1.778 m)  Weight: 174 lb (78.926 kg)  SpO2: 96%    PHYSICAL EXAM General: NAD HEENT: Normal. Neck: No JVD, no thyromegaly. Lungs: Clear to auscultation bilaterally with normal respiratory effort. CV: Nondisplaced PMI.  Regular rate and rhythm, normal S1/S2, no S3/S4, no murmur. No pretibial or periankle edema.  No carotid bruit.    Abdomen: Soft, nontender, no distention.  Neurologic: Alert and oriented x 3.  Psych: Normal affect. Skin: Normal. Musculoskeletal: Normal range of motion, no gross deformities. Extremities: No clubbing or cyanosis.   ECG: Most recent ECG reviewed.      ASSESSMENT AND PLAN: 1. CAD: Symptomatically stable with normal ECG. Continue Lipitor, ASA and Plavix (will continue DAPT indefinitely), lisinopril, and metoprolol.  2. Essential HTN: Mildly elevated but was initially very anxious. Will monitor without changes.  3. Hyperlipidemia: Will check. Continue Lipitor.  Dispo: f/u 1 year.   Kate Sable, M.D., F.A.C.C.

## 2015-10-13 ENCOUNTER — Encounter: Payer: Self-pay | Admitting: *Deleted

## 2015-10-23 ENCOUNTER — Encounter: Payer: Self-pay | Admitting: *Deleted

## 2015-12-10 ENCOUNTER — Telehealth: Payer: Self-pay | Admitting: *Deleted

## 2015-12-10 NOTE — Telephone Encounter (Signed)
Patient last seen 09/02/2015 by Dr. Bronson Ing.  Lipids requested at that time.  On 10/13/2015 - labs requested from pmd, nothing received.  Letter mailed to patient on 10/23/2015.    Attempted to reach patient by phone today as no labs received to present date.  Left message for return call.

## 2015-12-18 NOTE — Telephone Encounter (Signed)
No return call received to present date.

## 2015-12-26 ENCOUNTER — Other Ambulatory Visit: Payer: Self-pay | Admitting: Cardiovascular Disease

## 2016-08-11 ENCOUNTER — Other Ambulatory Visit: Payer: Self-pay | Admitting: Cardiovascular Disease

## 2016-08-27 DIAGNOSIS — Z23 Encounter for immunization: Secondary | ICD-10-CM | POA: Diagnosis not present

## 2016-09-11 ENCOUNTER — Other Ambulatory Visit: Payer: Self-pay | Admitting: Cardiovascular Disease

## 2016-10-15 ENCOUNTER — Other Ambulatory Visit: Payer: Self-pay | Admitting: Cardiovascular Disease

## 2016-11-29 ENCOUNTER — Other Ambulatory Visit: Payer: Self-pay | Admitting: Cardiovascular Disease

## 2016-11-29 MED ORDER — CLOPIDOGREL BISULFATE 75 MG PO TABS: ORAL_TABLET | ORAL | 6 refills | Status: DC

## 2016-11-29 MED ORDER — METOPROLOL TARTRATE 25 MG PO TABS: ORAL_TABLET | ORAL | 2 refills | Status: DC

## 2016-11-29 NOTE — Telephone Encounter (Signed)
Pt has fu apt,refills complete

## 2016-11-29 NOTE — Telephone Encounter (Signed)
Refill:  Lopressor 25 mg  And Plavix 75 mg   [facility]

## 2016-12-17 ENCOUNTER — Ambulatory Visit (INDEPENDENT_AMBULATORY_CARE_PROVIDER_SITE_OTHER): Payer: BLUE CROSS/BLUE SHIELD | Admitting: Cardiovascular Disease

## 2016-12-17 ENCOUNTER — Encounter: Payer: Self-pay | Admitting: Cardiovascular Disease

## 2016-12-17 ENCOUNTER — Telehealth: Payer: Self-pay | Admitting: Cardiovascular Disease

## 2016-12-17 VITALS — BP 144/83 | HR 96 | Ht 70.0 in | Wt 180.0 lb

## 2016-12-17 DIAGNOSIS — I25118 Atherosclerotic heart disease of native coronary artery with other forms of angina pectoris: Secondary | ICD-10-CM | POA: Diagnosis not present

## 2016-12-17 DIAGNOSIS — E785 Hyperlipidemia, unspecified: Secondary | ICD-10-CM

## 2016-12-17 DIAGNOSIS — I10 Essential (primary) hypertension: Secondary | ICD-10-CM | POA: Diagnosis not present

## 2016-12-17 MED ORDER — CLOPIDOGREL BISULFATE 75 MG PO TABS: ORAL_TABLET | ORAL | 6 refills | Status: DC

## 2016-12-17 MED ORDER — METOPROLOL TARTRATE 25 MG PO TABS: ORAL_TABLET | ORAL | 2 refills | Status: DC

## 2016-12-17 MED ORDER — METOPROLOL TARTRATE 25 MG PO TABS: ORAL_TABLET | ORAL | 6 refills | Status: DC

## 2016-12-17 MED ORDER — TAMSULOSIN HCL 0.4 MG PO CAPS: 0.4000 mg | ORAL_CAPSULE | Freq: Every day | ORAL | 1 refills | Status: DC

## 2016-12-17 MED ORDER — LISINOPRIL 20 MG PO TABS: ORAL_TABLET | ORAL | 6 refills | Status: DC

## 2016-12-17 NOTE — Patient Instructions (Signed)
Your physician wants you to follow-up in: 1 year with Dr. Jacinta Shoe. You will receive a reminder letter in the mail two months in advance. If you don't receive a letter, please call our office to schedule the follow-up appointment.  Your physician recommends that you return for lab work.  LIPIDS--Please remember to fast 6-8 hours prior to lab work.    Thank you for choosing [facility]!!

## 2016-12-17 NOTE — Progress Notes (Signed)
SUBJECTIVE: Mr. Mohl presents for a followup visit. He has a known history of coronary artery disease status post angioplasty and bare-metal stent placement to the LAD and RCA in 2000.  He then presented in February 2013 to [facility] with a non-ST elevation myocardial infarction. He underwent cardiac catheterization which showed an occluded RCA at the previously placed stent with left-to-right collaterals. The LAD had 50% proximal stenosis. The stent in the mid segment was patent with minimal restenosis. The left circumflex had an ulcerated 80% stenosis in the midsegment as well as 80% stenosis in OM 3. The patient underwent angioplasty and drug-eluting stent placement to both OM 3 and left circumflex. Ejection fraction was normal.  There have been issues with medication compliance in the past, as well as requesting Xanax.  He denies chest pain and shortness of breath. He has not had to use nitroglycerin. He has not had any recent hospitalizations.  He said he is always anxious when going to a physician's office.   He has been taking antiplatelet therapy daily.   ECG performed in the office today demonstrates normal sinus rhythm with left anterior fascicular block and undulating artifact.  Soc: His wife has multiple sclerosis. They have a daughter, [name]. 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  Review of Systems: As per "subjective", otherwise negative.  No Known Allergies  Current Outpatient Prescriptions  Medication Sig Dispense Refill  . aspirin EC 81 MG tablet Take 1 tablet (81 mg total) by mouth daily. 30 tablet 11  . atorvastatin (LIPITOR) 40 MG tablet TAKE 1 TABLET ONCE DAILY IN THE EVENING. 30 tablet 0  . clopidogrel (PLAVIX) 75 MG tablet TAKE (1) TABLET BY MOUTH ONCE DAILY. 30 tablet 6  . lisinopril (PRINIVIL,ZESTRIL) 20 MG tablet TAKE (1) TABLET BY MOUTH ONCE DAILY. 30 tablet 2  . metoprolol tartrate (LOPRESSOR) 25 MG tablet TAKE (1) TABLET  TWICE DAILY. 60 tablet 2  . nitroGLYCERIN (NITROSTAT) 0.4 MG SL tablet Place 1 tablet (0.4 mg total) under the tongue every 5 (five) minutes x 3 doses as needed for chest pain. 25 tablet 3  . tamsulosin (FLOMAX) 0.4 MG CAPS capsule Take 0.4 mg by mouth daily.    . Omega-3 Fatty Acids (FISH OIL) 1000 MG CAPS Take 1 capsule by mouth daily.     No current facility-administered medications for this visit.     Past Medical History:  Diagnosis Date  . Alcohol abuse, episodic    8 beers per weekend  . Angina   . CAD (coronary artery disease)    cath 2000. Occluded LAD and significant RCA stenosis. BMS to LAD and RCA. NSTEMI in 12/2011: cath showed occluded RCA stent, 50% proximal LAD stenosis, patent stent, 80% mid LCX and 80% OM3. Had DES placement to both OM3 and LCX.   Marland Kitchen Headache(784.0)   . Hyperlipidemia   . Hypertension   . Opioid dependence ([employer])   . Tobacco use     Past Surgical History:  Procedure Laterality Date  . APPENDECTOMY    . CORONARY ANGIOPLASTY    . HERNIA REPAIR    . LEFT HEART CATHETERIZATION WITH CORONARY ANGIOGRAM N/A 01/05/2012   Procedure: LEFT HEART CATHETERIZATION WITH CORONARY ANGIOGRAM;  Surgeon: Burnell Blanks, MD;  Location: [facility] CATH LAB;  Service: Cardiovascular;  Laterality: N/A;  . PERCUTANEOUS CORONARY STENT INTERVENTION (PCI-S)  01/05/2012   Procedure: PERCUTANEOUS CORONARY STENT INTERVENTION (PCI-S);  Surgeon: Burnell Blanks, MD;  Location: [facility] CATH LAB;  Service: Cardiovascular;;    Social History   Social History  . Marital status: Married    Spouse name: N/A  . Number of children: N/A  . Years of education: N/A   Occupational History  . Not on file.   Social History Main Topics  . Smoking status: Current Every Day Smoker    Packs/day: 1.25    Years: 35.00    Types: Cigarettes    Start date: 06/05/1976  . Smokeless tobacco: Never Used     Comment: electronic cigarette  . Alcohol use 4.8 oz/week    8 Cans of beer per week    . Drug use: Yes    Frequency: 6.0 times per week    Types: Hydromorphone  . Sexual activity: Yes   Other Topics Concern  . Not on file   Social History Narrative  . No narrative on file     Vitals:   12/17/16 1533  BP: (!) 144/83  Pulse: 96  Weight: 180 lb (81.6 kg)  Height: 5\' 10"  (1.778 m)    PHYSICAL EXAM General: NAD HEENT: Normal. Neck: No JVD, no thyromegaly. Lungs: Clear to auscultation bilaterally with normal respiratory effort. CV: Nondisplaced PMI.  Regular rate and rhythm, normal S1/S2, no S3/S4, no murmur. No pretibial or periankle edema.  No carotid bruit.   Abdomen: Soft, nontender, no distention.  Neurologic: Alert and oriented.  Psych: Normal affect. Skin: Normal. Musculoskeletal: No gross deformities.    ECG: Most recent ECG reviewed.      ASSESSMENT AND PLAN: 1. CAD: Symptomatically stable. Continue Lipitor, ASA and Plavix (will continue DAPT indefinitely), lisinopril, and metoprolol.  2. Essential HTN: Mildly elevated. He said it is always elevated when going to a physician's office but always normalizes. Will monitor without changes.  3. Hyperlipidemia: Will check lipids. Continue Lipitor.  Dispo: f/u 1 year.   Kate Sable, M.D., F.A.C.C.

## 2016-12-17 NOTE — Telephone Encounter (Signed)
Please send refills in for the following for year supply to [facility]  clopidogrel (PLAVIX) 75 MG tablet   lisinopril (PRINIVIL,ZESTRIL) 20 MG tablet   metoprolol tartrate (LOPRESSOR) 25 MG tablet

## 2016-12-31 ENCOUNTER — Telehealth: Payer: Self-pay | Admitting: Internal Medicine

## 2016-12-31 MED ORDER — ATORVASTATIN CALCIUM 40 MG PO TABS: 40.0000 mg | ORAL_TABLET | Freq: Every day | ORAL | 6 refills | Status: DC

## 2016-12-31 NOTE — Telephone Encounter (Signed)
Done

## 2016-12-31 NOTE — Telephone Encounter (Signed)
atorvastatin (LIPITOR) 40 MG tablet  Refill request  [facility]

## 2017-04-29 ENCOUNTER — Encounter: Payer: Self-pay | Admitting: *Deleted

## 2017-05-02 ENCOUNTER — Other Ambulatory Visit: Payer: Self-pay | Admitting: Cardiovascular Disease

## 2017-06-24 ENCOUNTER — Other Ambulatory Visit: Payer: Self-pay | Admitting: Cardiovascular Disease

## 2017-06-30 ENCOUNTER — Encounter (HOSPITAL_COMMUNITY): Payer: Self-pay | Admitting: Emergency Medicine

## 2017-06-30 ENCOUNTER — Emergency Department (HOSPITAL_COMMUNITY): Payer: BLUE CROSS/BLUE SHIELD

## 2017-06-30 ENCOUNTER — Emergency Department (HOSPITAL_COMMUNITY)
Admission: EM | Admit: 2017-06-30 | Discharge: 2017-07-01 | Disposition: A | Discharge status: Home / Self Care | Payer: BLUE CROSS/BLUE SHIELD | Attending: Emergency Medicine | Admitting: Emergency Medicine

## 2017-06-30 DIAGNOSIS — F17210 Nicotine dependence, cigarettes, uncomplicated: Secondary | ICD-10-CM | POA: Insufficient documentation

## 2017-06-30 DIAGNOSIS — N132 Hydronephrosis with renal and ureteral calculous obstruction: Secondary | ICD-10-CM | POA: Diagnosis not present

## 2017-06-30 DIAGNOSIS — N1330 Unspecified hydronephrosis: Secondary | ICD-10-CM

## 2017-06-30 DIAGNOSIS — N3289 Other specified disorders of bladder: Secondary | ICD-10-CM | POA: Diagnosis not present

## 2017-06-30 DIAGNOSIS — N50812 Left testicular pain: Secondary | ICD-10-CM | POA: Diagnosis not present

## 2017-06-30 DIAGNOSIS — R1084 Generalized abdominal pain: Secondary | ICD-10-CM | POA: Insufficient documentation

## 2017-06-30 DIAGNOSIS — I2510 Atherosclerotic heart disease of native coronary artery without angina pectoris: Secondary | ICD-10-CM | POA: Diagnosis not present

## 2017-06-30 DIAGNOSIS — I77819 Aortic ectasia, unspecified site: Secondary | ICD-10-CM | POA: Diagnosis not present

## 2017-06-30 DIAGNOSIS — R109 Unspecified abdominal pain: Secondary | ICD-10-CM

## 2017-06-30 DIAGNOSIS — I252 Old myocardial infarction: Secondary | ICD-10-CM | POA: Diagnosis not present

## 2017-06-30 DIAGNOSIS — I10 Essential (primary) hypertension: Secondary | ICD-10-CM | POA: Insufficient documentation

## 2017-06-30 LAB — URINALYSIS, ROUTINE W REFLEX MICROSCOPIC
Bilirubin Urine: NEGATIVE
Glucose, UA: NEGATIVE mg/dL
Hgb urine dipstick: NEGATIVE
Ketones, ur: NEGATIVE mg/dL
Leukocytes, UA: NEGATIVE
Nitrite: NEGATIVE
Protein, ur: NEGATIVE mg/dL
Specific Gravity, Urine: 1.005 (ref 1.005–1.030)
pH: 5 (ref 5.0–8.0)

## 2017-06-30 MED ORDER — OXYCODONE-ACETAMINOPHEN 5-325 MG PO TABS
2.0000 | ORAL_TABLET | Freq: Once | ORAL | Status: AC
  Administered 2017-07-01: 2 via ORAL
  Filled 2017-06-30 (×2): qty 2

## 2017-06-30 MED ORDER — KETOROLAC TROMETHAMINE 30 MG/ML IJ SOLN
30.0000 mg | Freq: Once | INTRAMUSCULAR | Status: AC
  Administered 2017-06-30: 30 mg via INTRAVENOUS
  Filled 2017-06-30: qty 1

## 2017-06-30 NOTE — ED Provider Notes (Signed)
Adrian Rivera DEPT Provider Note   CSN: [id number] Arrival date & time: 06/30/17  2153     History   Chief Complaint Chief Complaint  Patient presents with  . Flank Pain    HPI Adrian Rivera is a 56 y.o. male.  He presents for evaluation of left-sided flank pain, radiating to the left groin.  No trauma.  Pain persistent for several hours.  No dysuria, urinary frequency, nausea, vomiting, weakness or dizziness.  No prior similar problem.  Family history positive for kidney stones in his father.  There are no other known modifying factors.  HPI  Past Medical History:  Diagnosis Date  . Alcohol abuse, episodic    8 beers per weekend  . Angina   . CAD (coronary artery disease)    cath 2000. Occluded LAD and significant RCA stenosis. BMS to LAD and RCA. NSTEMI in 12/2011: cath showed occluded RCA stent, 50% proximal LAD stenosis, patent stent, 80% mid LCX and 80% OM3. Had DES placement to both OM3 and LCX.   Marland Kitchen Headache(784.0)   . Hyperlipidemia   . Hypertension   . Opioid dependence ([place])   . Tobacco use     Patient Active Problem List   Diagnosis Date Noted  . Hyperlipidemia   . Hypertension   . NSTEMI (non-ST elevated myocardial infarction) ([place]) 01/06/2012  . Inferior MI ([place]) 01/05/2012  . NSVT (nonsustained ventricular tachycardia) ([place]) 01/05/2012  . CAD (coronary artery disease) 01/05/2012  . Hypotension 01/05/2012  . Tobacco use 01/05/2012    Past Surgical History:  Procedure Laterality Date  . APPENDECTOMY    . CORONARY ANGIOPLASTY    . HERNIA REPAIR    . LEFT HEART CATHETERIZATION WITH CORONARY ANGIOGRAM N/A 01/05/2012   Procedure: LEFT HEART CATHETERIZATION WITH CORONARY ANGIOGRAM;  Surgeon: Burnell Blanks, MD;  Location: [facility] CATH LAB;  Service: Cardiovascular;  Laterality: N/A;  . PERCUTANEOUS CORONARY STENT INTERVENTION (PCI-S)  01/05/2012   Procedure: PERCUTANEOUS CORONARY STENT INTERVENTION (PCI-S);  Surgeon: Burnell Blanks, MD;   Location: [facility] CATH LAB;  Service: Cardiovascular;;       Home Medications    Prior to Admission medications   Medication Sig Start Date End Date Taking? Authorizing Provider  atorvastatin (LIPITOR) 40 MG tablet Take 1 tablet (40 mg total) by mouth daily. 12/31/16  Yes Herminio Commons, MD  clopidogrel (PLAVIX) 75 MG tablet TAKE (1) TABLET BY MOUTH ONCE DAILY. 12/17/16  Yes Herminio Commons, MD  lisinopril (PRINIVIL,ZESTRIL) 20 MG tablet TAKE (1) TABLET BY MOUTH ONCE DAILY. 12/17/16  Yes Herminio Commons, MD  metoprolol tartrate (LOPRESSOR) 25 MG tablet TAKE (1) TABLET TWICE DAILY. 05/02/17  Yes Herminio Commons, MD  nitroGLYCERIN (NITROSTAT) 0.4 MG SL tablet Place 1 tablet (0.4 mg total) under the tongue every 5 (five) minutes x 3 doses as needed for chest pain. 12/12/14  Yes Herminio Commons, MD  tamsulosin (FLOMAX) 0.4 MG CAPS capsule Take 1 capsule (0.4 mg total) by mouth daily. 12/17/16  Yes Herminio Commons, MD    Family History Family History  Problem Relation Age of Onset  . Cancer Mother   . Coronary artery disease Father   . Hypertension Father   . Hyperlipidemia Father   . Heart disease Father   . Heart attack Father   . Diabetes type II Father     Social History Social History  Substance Use Topics  . Smoking status: Current Every Day Smoker    Packs/day: 1.25  Years: 35.00    Types: Cigarettes    Start date: 06/05/1976  . Smokeless tobacco: Never Used     Comment: electronic cigarette  . Alcohol use 4.8 oz/week    8 Cans of beer per week     Allergies   Patient has no known allergies.   Review of Systems Review of Systems  All other systems reviewed and are negative.    Physical Exam Updated Vital Signs BP (!) 136/97   Pulse 74   Temp 98.2 F (36.8 C)   Resp 18   Ht 5\' 11"  (1.803 m)   Wt 73.9 kg (163 lb)   SpO2 100%   BMI 22.73 kg/m   Physical Exam  Constitutional: He is oriented to person, place, and time. He appears  well-developed and well-nourished. No distress.  HENT:  Head: Normocephalic and atraumatic.  Right Ear: External ear normal.  Left Ear: External ear normal.  Eyes: Pupils are equal, round, and reactive to light. Conjunctivae and EOM are normal.  Neck: Normal range of motion and phonation normal. Neck supple.  Cardiovascular: Normal rate, regular rhythm and normal heart sounds.   Pulmonary/Chest: Effort normal and breath sounds normal. He exhibits no bony tenderness.  Abdominal: Soft. There is no tenderness.  Genitourinary:  Genitourinary Comments: No costovertebral angle tenderness  Musculoskeletal: Normal range of motion. He exhibits no deformity.  No limitation of motion to movement of the back.  Mild left lumbar tenderness, without deformity.  Neurological: He is alert and oriented to person, place, and time. No cranial nerve deficit or sensory deficit. He exhibits normal muscle tone. Coordination normal.  Skin: Skin is warm, dry and intact.  Psychiatric: He has a normal mood and affect. His behavior is normal. Judgment and thought content normal.  Nursing note and vitals reviewed.    ED Treatments / Results  Labs (all labs ordered are listed, but only abnormal results are displayed) Labs Reviewed  URINALYSIS, ROUTINE W REFLEX MICROSCOPIC - Abnormal; Notable for the following:       Result Value   Color, Urine STRAW (*)    All other components within normal limits  I-STAT CHEM 8, ED    EKG  EKG Interpretation None       Radiology US Scrotum  Result Date: 07/01/2017 CLINICAL DATA:  LEFT testicular pain for 1 day. EXAM: SCROTAL ULTRASOUND DOPPLER ULTRASOUND OF THE TESTICLES TECHNIQUE: Complete ultrasound examination of the testicles, epididymis, and other scrotal structures was performed. Color and spectral Doppler ultrasound were also utilized to evaluate blood flow to the testicles. COMPARISON:  CT abdomen and pelvis June 30, 2017 FINDINGS: Right testicle Measurements: 5  x 2.5 x 2.7 cm. No mass or microlithiasis visualized. Left testicle Measurements: 4.7 x 2.2 x 3.1 cm. No mass or microlithiasis visualized. Right epididymis:  Normal in size and appearance. Left epididymis:  Normal in size and appearance. Hydrocele:  Small bilateral hydroceles. Varicocele:  None visualized. Pulsed Doppler interrogation of both testes demonstrates normal low resistance arterial and venous waveforms bilaterally. IMPRESSION: Small bilateral hydroceles, otherwise negative scrotal sonogram. Electronically Signed   By: Elon Alas M.D.   On: 07/01/2017 02:16   [place] Art/ven Flow Abd Pelv Doppler  Result Date: 07/01/2017 CLINICAL DATA:  LEFT testicular pain for 1 day. EXAM: SCROTAL ULTRASOUND DOPPLER ULTRASOUND OF THE TESTICLES TECHNIQUE: Complete ultrasound examination of the testicles, epididymis, and other scrotal structures was performed. Color and spectral Doppler ultrasound were also utilized to evaluate blood flow to the testicles. COMPARISON:  CT abdomen and pelvis June 30, 2017 FINDINGS: Right testicle Measurements: 5 x 2.5 x 2.7 cm. No mass or microlithiasis visualized. Left testicle Measurements: 4.7 x 2.2 x 3.1 cm. No mass or microlithiasis visualized. Right epididymis:  Normal in size and appearance. Left epididymis:  Normal in size and appearance. Hydrocele:  Small bilateral hydroceles. Varicocele:  None visualized. Pulsed Doppler interrogation of both testes demonstrates normal low resistance arterial and venous waveforms bilaterally. IMPRESSION: Small bilateral hydroceles, otherwise negative scrotal sonogram. Electronically Signed   By: Elon Alas M.D.   On: 07/01/2017 02:16   Ct Renal Stone Study  Result Date: 06/30/2017 CLINICAL DATA:  Acute onset of bilateral flank pain, worse on the left. Initial encounter. EXAM: CT ABDOMEN AND PELVIS WITHOUT CONTRAST TECHNIQUE: Multidetector CT imaging of the abdomen and pelvis was performed following the standard protocol without IV  contrast. COMPARISON:  None. FINDINGS: Lower chest: Diffuse coronary artery calcifications are seen. The visualized lung bases are relatively clear. Hepatobiliary: The liver is unremarkable in appearance. The gallbladder is unremarkable in appearance. The common bile duct remains normal in caliber. Pancreas: The pancreas is within normal limits. Spleen: The spleen is unremarkable in appearance. Adrenals/Urinary Tract: The adrenal glands are unremarkable in appearance. Mild bilateral hydronephrosis noted, more prominent on the right, without evidence of distal obstructing stone. This may reflect chronic bladder outlet obstruction given the appearance of the bladder. No perinephric stranding is seen. No renal or ureteral stones are identified. Stomach/Bowel: The stomach is unremarkable in appearance. The small bowel is within normal limits. The patient is status post appendectomy. The colon is unremarkable in appearance. Vascular/Lymphatic: There is ectasia of the infrarenal abdominal aorta, measuring up to 2.9 cm in AP dimension, without evidence of aneurysmal dilatation. Scattered calcification is seen along the abdominal aorta and its branches. No retroperitoneal or pelvic sidewall lymphadenopathy is seen. Reproductive: The bladder is significantly distended, with diffuse irregular bladder wall thickening, concerning for neurogenic bladder or chronic inflammation. The prostate is borderline normal in size. Other: No additional soft tissue abnormalities are seen. Musculoskeletal: No acute osseous abnormalities are identified. The visualized musculature is unremarkable in appearance. IMPRESSION: 1. Mild bilateral hydronephrosis, more more prominent on the right, without evidence of distal obstructing stone. This may reflect chronic bladder outlet obstruction given the appearance of the bladder. 2. Significantly distended bladder, with diffuse irregular bladder wall thickening, concerning for neurogenic bladder or  chronic inflammation. 3. Scattered aortic atherosclerosis. Ectatic abdominal aorta at risk for aneurysm development. Recommend followup by ultrasound in 5 years. This recommendation follows ACR consensus guidelines: White Paper of the ACR Incidental Findings Committee II on Vascular Findings. J Am Coll Radiol 2013; 10:789-794. 4. Diffuse coronary artery calcifications seen. Electronically Signed   By: Garald Balding M.D.   On: 06/30/2017 23:30    Procedures Procedures (including critical care time)  Medications Ordered in ED Medications  oxyCODONE-acetaminophen (PERCOCET/ROXICET) 5-325 MG per tablet 2 tablet (2 tablets Oral Given 07/01/17 0046)  ketorolac (TORADOL) 30 MG/ML injection 30 mg (30 mg Intravenous Given 06/30/17 2347)     Initial Impression / Assessment and Plan / ED Course  I have reviewed the triage vital signs and the nursing notes.  Pertinent labs & imaging results that were available during my care of the patient were reviewed by me and considered in my medical decision making (see chart for details).      Patient Vitals for the past 24 hrs:  BP Temp Pulse Resp SpO2 Height Weight  06/30/17 2200 - - - - -  5\' 11"  (1.803 m) 73.9 kg (163 lb)  06/30/17 2159 (!) 136/97 98.2 F (36.8 C) 74 18 100 % - -    CT renal scan ordered to evaluate for renal stone shows bilateral hydronephrosis, with a distended bladder, which appears chronic.  Patient has history of prostatic hypertrophy.  Blood work ordered to evaluate for renal failure, which would necessitate Foley catheterization.   Final Clinical Impressions(s) / ED Diagnoses   Final diagnoses:  Flank pain  Hydronephrosis, unspecified hydronephrosis type  Bladder distended  Ectatic aorta (HCC)  Left testicular pain    Flank pain nonspecific, with abnormal CT revealing incidental hydro-nephrosis, distended bladder, and ectatic abdominal aorta.  Patient will need follow-up for urologic and medical evaluation.  Nursing Notes  Reviewed/ Care Coordinated Applicable Imaging Reviewed Interpretation of Laboratory Data incorporated into ED treatment  Plan: Care to Dr. Wyvonnia Dusky to evaluate after labs return.  New Prescriptions Discharge Medication List as of 07/01/2017  2:51 AM       Daleen Bo, MD 07/01/17 1247

## 2017-06-30 NOTE — ED Triage Notes (Signed)
Pt c/o left flank that radiates to groin and the middle back.

## 2017-06-30 NOTE — ED Notes (Signed)
Patient states he taken subaxone and he would like something in place of opoid. EDP made aware, verbal orders obtained.

## 2017-06-30 NOTE — ED Notes (Signed)
Patient in ct at this time

## 2017-07-01 ENCOUNTER — Emergency Department (HOSPITAL_COMMUNITY): Payer: BLUE CROSS/BLUE SHIELD

## 2017-07-01 DIAGNOSIS — N50812 Left testicular pain: Secondary | ICD-10-CM | POA: Diagnosis not present

## 2017-07-01 LAB — I-STAT CHEM 8, ED
BUN: 15 mg/dL (ref 6–20)
Calcium, Ion: 1.22 mmol/L (ref 1.15–1.40)
Chloride: 103 mmol/L (ref 101–111)
Creatinine, Ser: 1 mg/dL (ref 0.61–1.24)
Glucose, Bld: 95 mg/dL (ref 65–99)
HCT: 40 % (ref 39.0–52.0)
Hemoglobin: 13.6 g/dL (ref 13.0–17.0)
Potassium: 4.3 mmol/L (ref 3.5–5.1)
Sodium: 138 mmol/L (ref 135–145)
TCO2: 28 mmol/L (ref 0–100)

## 2017-07-01 NOTE — ED Notes (Signed)
Pt bladder scan was >200 dr Melanee Left notified no new orders at this time

## 2017-07-01 NOTE — ED Provider Notes (Signed)
Care assumed from Dr. Eulis Foster. Patient presented with flank pain and CT showed bilateral hydronephrosis with distended bladder. Bladder scan greater than 200 mL.  Urinalysis negative. Patient is able to void. Ultrasound is negative for testicular torsion. Flank pain is improved the patient is sleeping comfortably.  He'll need to follow up with urology for his hydronephrosis and distended bladder. Creatinine is normal. Return precautions discussed.  BP (!) 136/97   Pulse 74   Temp 98.2 F (36.8 C)   Resp 18   Ht 5\' 11"  (1.803 m)   Wt 73.9 kg (163 lb)   SpO2 100%   BMI 22.73 kg/m     Ezequiel Essex, MD 07/01/17 [phone]

## 2017-07-01 NOTE — Discharge Instructions (Addendum)
Your kidneys and bladder are distended. Followup with the urologist. There is no evidence of kidney stone or urinary tract infection.  Your aorta is irregular in appearance, and will have to be followed, by checking for aneurysm, with an ultrasound in about 5 years.  You will need to follow-up with a urologist regarding the distended kidney and bladder.

## 2017-07-04 DIAGNOSIS — R338 Other retention of urine: Secondary | ICD-10-CM | POA: Diagnosis not present

## 2017-07-04 DIAGNOSIS — N1339 Other hydronephrosis: Secondary | ICD-10-CM | POA: Diagnosis not present

## 2017-07-05 DIAGNOSIS — R310 Gross hematuria: Secondary | ICD-10-CM | POA: Diagnosis not present

## 2017-07-05 DIAGNOSIS — R338 Other retention of urine: Secondary | ICD-10-CM | POA: Diagnosis not present

## 2017-07-11 DIAGNOSIS — R338 Other retention of urine: Secondary | ICD-10-CM | POA: Diagnosis not present

## 2017-07-11 DIAGNOSIS — N390 Urinary tract infection, site not specified: Secondary | ICD-10-CM | POA: Diagnosis not present

## 2017-07-11 DIAGNOSIS — R8271 Bacteriuria: Secondary | ICD-10-CM | POA: Diagnosis not present

## 2017-07-11 DIAGNOSIS — N401 Enlarged prostate with lower urinary tract symptoms: Secondary | ICD-10-CM | POA: Diagnosis not present

## 2017-07-14 DIAGNOSIS — N401 Enlarged prostate with lower urinary tract symptoms: Secondary | ICD-10-CM | POA: Diagnosis not present

## 2017-07-14 DIAGNOSIS — N390 Urinary tract infection, site not specified: Secondary | ICD-10-CM | POA: Diagnosis not present

## 2017-07-14 DIAGNOSIS — B955 Unspecified streptococcus as the cause of diseases classified elsewhere: Secondary | ICD-10-CM | POA: Diagnosis not present

## 2017-07-14 DIAGNOSIS — R338 Other retention of urine: Secondary | ICD-10-CM | POA: Diagnosis not present

## 2017-07-14 DIAGNOSIS — N1339 Other hydronephrosis: Secondary | ICD-10-CM | POA: Diagnosis not present

## 2017-07-20 ENCOUNTER — Other Ambulatory Visit: Payer: Self-pay | Admitting: Urology

## 2017-07-20 ENCOUNTER — Telehealth: Payer: Self-pay | Admitting: Cardiovascular Disease

## 2017-07-20 NOTE — Telephone Encounter (Signed)
Dr Bronson Ing has this on his desk and aware

## 2017-07-20 NOTE — Telephone Encounter (Signed)
[facility] is requesting authorization from Dr. Bronson Ing for pre op clearance. They stated that a form was Faxed to our office on 07-15-17.

## 2017-07-20 NOTE — Telephone Encounter (Signed)
Clearance formed faxed today

## 2017-07-21 DIAGNOSIS — Z978 Presence of other specified devices: Secondary | ICD-10-CM

## 2017-07-21 HISTORY — DX: Presence of other specified devices: Z97.8

## 2017-07-22 NOTE — Patient Instructions (Addendum)
Adrian Rivera  07/22/2017   Your procedure is scheduled on: Friday 08-05-17  Report to [facility] Main  Entrance Take [place]  elevators to 3rd floor to  [place] at 1000 AM.   Call this number if you have problems the morning of surgery [phone]   Remember: ONLY 1 PERSON MAY GO WITH YOU TO SHORT STAY TO GET  READY MORNING OF [place].  Do not eat food or drink liquids :After Midnight.     Take these medicines the morning of surgery with A SIP OF WATER: , METOPROLOL              You may not have any metal on your body including hair pins and              piercings  Do not wear jewelry, make-up, lotions, powders or perfumes, deodorant             Do not wear nail polish.  Do not shave  48 hours prior to surgery.              Men may shave face and neck.   Do not bring valuables to the hospital. [place].  Contacts, dentures or bridgework may not be worn into surgery.  Leave suitcase in the car. After surgery it may be brought to your room.     Patients discharged the day of surgery will not be allowed to drive home.  Name and phone number of your driver:tina Wiland wife cell [phone]  Special Instructions: N/A              Please read over the following fact sheets you were given: _____________________________________________________________________             [facility] - Preparing for Surgery Before surgery, you can play an important role.  Because skin is not sterile, your skin needs to be as free of germs as possible.  You can reduce the number of germs on your skin by washing with CHG (chlorahexidine gluconate) soap before surgery.  CHG is an antiseptic cleaner which kills germs and bonds with the skin to continue killing germs even after washing. Please DO NOT use if you have an allergy to CHG or antibacterial soaps.  If your skin becomes reddened/irritated  stop using the CHG and inform your nurse when you arrive at Short Stay. Do not shave (including legs and underarms) for at least 48 hours prior to the first CHG shower.  You may shave your face/neck. Please follow these instructions carefully:  1.  Shower with CHG Soap the night before surgery and the  morning of Surgery.  2.  If you choose to wash your hair, wash your hair first as usual with your  normal  shampoo.  3.  After you shampoo, rinse your hair and body thoroughly to remove the  shampoo.                           4.  Use CHG as you would any other liquid soap.  You can apply chg directly  to the skin and wash  Gently with a scrungie or clean washcloth.  5.  Apply the CHG Soap to your body ONLY FROM THE NECK DOWN.   Do not use on face/ open                           Wound or open sores. Avoid contact with eyes, ears mouth and genitals (private parts).                       Wash face,  Genitals (private parts) with your normal soap.             6.  Wash thoroughly, paying special attention to the area where your surgery  will be performed.  7.  Thoroughly rinse your body with warm water from the neck down.  8.  DO NOT shower/wash with your normal soap after using and rinsing off  the CHG Soap.                9.  Pat yourself dry with a clean towel.            10.  Wear clean pajamas.            11.  Place clean sheets on your bed the night of your first shower and do not  sleep with pets. Day of Surgery : Do not apply any lotions/deodorants the morning of surgery.  Please wear clean clothes to the hospital/surgery center.  FAILURE TO FOLLOW THESE INSTRUCTIONS MAY RESULT IN THE CANCELLATION OF YOUR SURGERY PATIENT SIGNATURE_________________________________  NURSE SIGNATURE__________________________________  ________________________________________________________________________

## 2017-07-28 NOTE — Progress Notes (Addendum)
Cardiac clearance dr Tia Alert 07-15-17 on chart for 08-05-17 surgery with dr bell. ekg 12-19-26 epic

## 2017-07-29 ENCOUNTER — Encounter (HOSPITAL_COMMUNITY)
Admission: RE | Admit: 2017-07-29 | Discharge: 2017-07-29 | Disposition: A | Discharge status: Home / Self Care | Payer: BLUE CROSS/BLUE SHIELD | Source: Ambulatory Visit | Attending: Urology | Admitting: Urology

## 2017-07-29 ENCOUNTER — Encounter (HOSPITAL_COMMUNITY): Payer: Self-pay

## 2017-07-29 DIAGNOSIS — N401 Enlarged prostate with lower urinary tract symptoms: Secondary | ICD-10-CM | POA: Insufficient documentation

## 2017-07-29 DIAGNOSIS — Z01812 Encounter for preprocedural laboratory examination: Secondary | ICD-10-CM | POA: Diagnosis not present

## 2017-07-29 HISTORY — DX: Rash and other nonspecific skin eruption: R21

## 2017-07-29 HISTORY — DX: Benign prostatic hyperplasia without lower urinary tract symptoms: N40.0

## 2017-07-29 HISTORY — DX: Acute myocardial infarction, unspecified: I21.9

## 2017-07-29 HISTORY — DX: Presence of urogenital implants: Z96.0

## 2017-07-29 LAB — CBC
HCT: 44.7 % (ref 39.0–52.0)
Hemoglobin: 15.3 g/dL (ref 13.0–17.0)
MCH: 30.4 pg (ref 26.0–34.0)
MCHC: 34.2 g/dL (ref 30.0–36.0)
MCV: 88.9 fL (ref 78.0–100.0)
Platelets: 217 10*3/uL (ref 150–400)
RBC: 5.03 MIL/uL (ref 4.22–5.81)
RDW: 13.4 % (ref 11.5–15.5)
WBC: 7.4 10*3/uL (ref 4.0–10.5)

## 2017-07-29 LAB — BASIC METABOLIC PANEL
Anion gap: 5 (ref 5–15)
BUN: 19 mg/dL (ref 6–20)
CO2: 29 mmol/L (ref 22–32)
Calcium: 9.4 mg/dL (ref 8.9–10.3)
Chloride: 104 mmol/L (ref 101–111)
Creatinine, Ser: 1.04 mg/dL (ref 0.61–1.24)
GFR calc Af Amer: >60 mL/min (ref >60)
GFR calc non Af Amer: >60 mL/min (ref >60)
Glucose, Bld: 112 mg/dL — ABNORMAL HIGH (ref 65–99)
Potassium: 4.6 mmol/L (ref 3.5–5.1)
Sodium: 138 mmol/L (ref 135–145)

## 2017-08-05 ENCOUNTER — Ambulatory Visit (HOSPITAL_COMMUNITY): Payer: BLUE CROSS/BLUE SHIELD | Admitting: Anesthesiology

## 2017-08-05 ENCOUNTER — Encounter (HOSPITAL_COMMUNITY): Payer: Self-pay | Admitting: *Deleted

## 2017-08-05 ENCOUNTER — Ambulatory Visit (HOSPITAL_COMMUNITY)
Admission: RE | Admit: 2017-08-05 | Discharge: 2017-08-05 | Disposition: A | Discharge status: Home / Self Care | Payer: BLUE CROSS/BLUE SHIELD | Source: Ambulatory Visit | Attending: Urology | Admitting: Urology

## 2017-08-05 ENCOUNTER — Encounter (HOSPITAL_COMMUNITY)
Admission: RE | Disposition: A | Discharge status: Home / Self Care | Payer: Self-pay | Source: Ambulatory Visit | Attending: Urology

## 2017-08-05 DIAGNOSIS — N138 Other obstructive and reflux uropathy: Secondary | ICD-10-CM | POA: Diagnosis not present

## 2017-08-05 DIAGNOSIS — F17210 Nicotine dependence, cigarettes, uncomplicated: Secondary | ICD-10-CM | POA: Insufficient documentation

## 2017-08-05 DIAGNOSIS — I2510 Atherosclerotic heart disease of native coronary artery without angina pectoris: Secondary | ICD-10-CM | POA: Diagnosis not present

## 2017-08-05 DIAGNOSIS — I252 Old myocardial infarction: Secondary | ICD-10-CM | POA: Insufficient documentation

## 2017-08-05 DIAGNOSIS — N320 Bladder-neck obstruction: Secondary | ICD-10-CM | POA: Diagnosis not present

## 2017-08-05 DIAGNOSIS — R339 Retention of urine, unspecified: Secondary | ICD-10-CM | POA: Diagnosis not present

## 2017-08-05 DIAGNOSIS — E7800 Pure hypercholesterolemia, unspecified: Secondary | ICD-10-CM | POA: Insufficient documentation

## 2017-08-05 DIAGNOSIS — I10 Essential (primary) hypertension: Secondary | ICD-10-CM | POA: Insufficient documentation

## 2017-08-05 DIAGNOSIS — N400 Enlarged prostate without lower urinary tract symptoms: Secondary | ICD-10-CM | POA: Diagnosis not present

## 2017-08-05 DIAGNOSIS — N401 Enlarged prostate with lower urinary tract symptoms: Secondary | ICD-10-CM | POA: Insufficient documentation

## 2017-08-05 HISTORY — PX: TRANSURETHRAL RESECTION OF PROSTATE: SHX73

## 2017-08-05 SURGERY — TURP (TRANSURETHRAL RESECTION OF PROSTATE)
Anesthesia: General

## 2017-08-05 MED ORDER — OXYCODONE HCL 5 MG PO TABS: 5.0000 mg | ORAL_TABLET | Freq: Once | ORAL | Status: DC | PRN

## 2017-08-05 MED ORDER — FENTANYL CITRATE (PF) 100 MCG/2ML IJ SOLN
25.0000 ug | INTRAMUSCULAR | Status: DC | PRN
  Administered 2017-08-05 (×4): 25 ug via INTRAVENOUS

## 2017-08-05 MED ORDER — FENTANYL CITRATE (PF) 100 MCG/2ML IJ SOLN
INTRAMUSCULAR | Status: AC
  Administered 2017-08-05: 25 ug via INTRAVENOUS
  Filled 2017-08-05: qty 2

## 2017-08-05 MED ORDER — MIDAZOLAM HCL 2 MG/2ML IJ SOLN
INTRAMUSCULAR | Status: AC
  Filled 2017-08-05: qty 2

## 2017-08-05 MED ORDER — MIDAZOLAM HCL 5 MG/5ML IJ SOLN
INTRAMUSCULAR | Status: DC | PRN
  Administered 2017-08-05: 2 mg via INTRAVENOUS

## 2017-08-05 MED ORDER — FENTANYL CITRATE (PF) 100 MCG/2ML IJ SOLN
INTRAMUSCULAR | Status: DC | PRN
  Administered 2017-08-05 (×2): 50 ug via INTRAVENOUS

## 2017-08-05 MED ORDER — PROPOFOL 10 MG/ML IV BOLUS
INTRAVENOUS | Status: DC | PRN
  Administered 2017-08-05: 200 mg via INTRAVENOUS

## 2017-08-05 MED ORDER — LIDOCAINE 2% (20 MG/ML) 5 ML SYRINGE
INTRAMUSCULAR | Status: DC | PRN
  Administered 2017-08-05: 100 mg via INTRAVENOUS

## 2017-08-05 MED ORDER — CIPROFLOXACIN IN D5W 400 MG/200ML IV SOLN
INTRAVENOUS | Status: AC
  Filled 2017-08-05: qty 200

## 2017-08-05 MED ORDER — CIPROFLOXACIN IN D5W 400 MG/200ML IV SOLN
400.0000 mg | Freq: Once | INTRAVENOUS | Status: AC
  Administered 2017-08-05: 400 mg via INTRAVENOUS

## 2017-08-05 MED ORDER — ONDANSETRON HCL 4 MG/2ML IJ SOLN
INTRAMUSCULAR | Status: AC
  Filled 2017-08-05: qty 2

## 2017-08-05 MED ORDER — ROCURONIUM BROMIDE 50 MG/5ML IV SOSY
PREFILLED_SYRINGE | INTRAVENOUS | Status: AC
  Filled 2017-08-05: qty 5

## 2017-08-05 MED ORDER — LIDOCAINE 2% (20 MG/ML) 5 ML SYRINGE
INTRAMUSCULAR | Status: AC
  Filled 2017-08-05: qty 5

## 2017-08-05 MED ORDER — PROMETHAZINE HCL 25 MG/ML IJ SOLN: 6.2500 mg | INTRAMUSCULAR | Status: DC | PRN

## 2017-08-05 MED ORDER — KETOROLAC TROMETHAMINE 30 MG/ML IJ SOLN
30.0000 mg | Freq: Once | INTRAMUSCULAR | Status: DC | PRN
Start: 2017-08-05 — End: 2017-08-05

## 2017-08-05 MED ORDER — ONDANSETRON HCL 4 MG/2ML IJ SOLN
INTRAMUSCULAR | Status: DC | PRN
  Administered 2017-08-05: 4 mg via INTRAVENOUS

## 2017-08-05 MED ORDER — OXYCODONE-ACETAMINOPHEN 5-325 MG PO TABS
1.0000 | ORAL_TABLET | Freq: Four times a day (QID) | ORAL | 0 refills | Status: DC | PRN
Start: 2017-08-05 — End: 2018-04-04

## 2017-08-05 MED ORDER — SODIUM CHLORIDE 0.9 % IR SOLN: Status: DC | PRN

## 2017-08-05 MED ORDER — PROPOFOL 10 MG/ML IV BOLUS
INTRAVENOUS | Status: AC
  Filled 2017-08-05: qty 20

## 2017-08-05 MED ORDER — OXYCODONE HCL 5 MG/5ML PO SOLN: 5.0000 mg | Freq: Once | ORAL | Status: DC | PRN

## 2017-08-05 MED ORDER — DEXAMETHASONE SODIUM PHOSPHATE 10 MG/ML IJ SOLN
INTRAMUSCULAR | Status: DC | PRN
  Administered 2017-08-05: 10 mg via INTRAVENOUS

## 2017-08-05 MED ORDER — SODIUM CHLORIDE 0.9 % IR SOLN
Status: DC | PRN
  Administered 2017-08-05: 15000 mL

## 2017-08-05 MED ORDER — LACTATED RINGERS IV SOLN
INTRAVENOUS | Status: DC
  Administered 2017-08-05: 1000 mL via INTRAVENOUS
  Administered 2017-08-05: 11:00:00 via INTRAVENOUS

## 2017-08-05 MED ORDER — DEXAMETHASONE SODIUM PHOSPHATE 10 MG/ML IJ SOLN
INTRAMUSCULAR | Status: AC
  Filled 2017-08-05: qty 1

## 2017-08-05 MED ORDER — FENTANYL CITRATE (PF) 100 MCG/2ML IJ SOLN
INTRAMUSCULAR | Status: AC
  Filled 2017-08-05: qty 2

## 2017-08-05 SURGICAL SUPPLY — 16 items
BAG URINE DRAINAGE (UROLOGICAL SUPPLIES) ×2 IMPLANT
BAG URO CATCHER STRL LF (MISCELLANEOUS) ×2 IMPLANT
CATH HEMA 3WAY 30CC 22FR COUDE (CATHETERS) ×2 IMPLANT
CLOTH BEACON ORANGE TIMEOUT ST (SAFETY) ×2 IMPLANT
COVER FOOTSWITCH UNIV (MISCELLANEOUS) ×2 IMPLANT
ELECT REM PT RETURN 15FT ADLT (MISCELLANEOUS) ×2 IMPLANT
GLOVE BIOGEL M STRL SZ7.5 (GLOVE) ×2 IMPLANT
GOWN STRL REUS W/TWL LRG LVL3 (GOWN DISPOSABLE) ×2 IMPLANT
GOWN STRL REUS W/TWL XL LVL3 (GOWN DISPOSABLE) ×2 IMPLANT
HOLDER FOLEY CATH W/STRAP (MISCELLANEOUS) ×2 IMPLANT
LOOP CUT BIPOLAR 24F LRG (ELECTROSURGICAL) ×2 IMPLANT
MANIFOLD NEPTUNE II (INSTRUMENTS) ×2 IMPLANT
PACK CYSTO (CUSTOM PROCEDURE TRAY) ×2 IMPLANT
SET ASPIRATION TUBING (TUBING) ×2 IMPLANT
SYRINGE IRR TOOMEY STRL 70CC (SYRINGE) ×2 IMPLANT
TUBING CONNECTING 10 (TUBING) ×2 IMPLANT

## 2017-08-05 NOTE — Anesthesia Procedure Notes (Signed)
Procedure Name: LMA Insertion Date/Time: 08/05/2017 12:11 PM Performed by: Lind Covert Pre-anesthesia Checklist: Patient identified, Emergency Drugs available, Suction available, Patient being monitored and Timeout performed Patient Re-evaluated:Patient Re-evaluated prior to induction Oxygen Delivery Method: Circle system utilized Preoxygenation: Pre-oxygenation with 100% oxygen Induction Type: IV induction LMA: LMA inserted LMA Size: 5.0 Number of attempts: 1 Placement Confirmation: positive ETCO2 and breath sounds checked- equal and bilateral Tube secured with: Tape Dental Injury: Teeth and Oropharynx as per pre-operative assessment

## 2017-08-05 NOTE — Op Note (Signed)
Preoperative diagnosis: 1. Bladder outlet obstruction secondary to BPH  Postoperative diagnosis:  1. Bladder outlet obstruction secondary to BPH  Procedure:  1. Cystoscopy 2. Transurethral resection of the prostate  Surgeon: Marton Redwood, III. M.D.  Anesthesia: General  Complications: None  EBL: Minimal  Specimens: 1. Prostate chips  Indication: MIDAS DAUGHETY III is a patient with bladder outlet obstruction secondary to benign prostatic hyperplasia. After reviewing the management options for treatment, he elected to proceed with the above surgical procedure(s). We have discussed the potential benefits and risks of the procedure, side effects of the proposed treatment, the likelihood of the patient achieving the goals of the procedure, and any potential problems that might occur during the procedure or recuperation. Informed consent has been obtained.  Description of procedure:  The patient was taken to the operating room and general anesthesia was induced.  The patient was placed in the dorsal lithotomy position, prepped and draped in the usual sterile fashion, and preoperative antibiotics were administered. A preoperative time-out was performed.   Cystourethroscopy was performed.  The patient's urethra was examined and was normal/ demonstrated bilobar prostatic hypertrophy.   The bladder was then systematically examined in its entirety. There was no evidence of any bladder tumors, stones, or other mucosal pathology procedure performed the bladder was moderately to severely trabeculated.  The ureteral orifices were identified and marked so as to be avoided during the procedure.  The prostate adenoma was then resected utilizing loop cautery resection with the bipolar cutting loop.  The prostate adenoma from the bladder neck back to the verumontanum was resected beginning at the six o'clock position and then extended to include the right and left lobes of the prostate and anterior  prostate. Care was taken not to resect distal to the verumontanum.  Hemostasis was then achieved with the cautery and the bladder was emptied and reinspected with no significant bleeding noted at the end of the procedure.    A 3 way catheter was then placed into the bladder.  The patient appeared to tolerate the procedure well and without complications.  The patient was able to be awakened and transferred to the recovery unit in satisfactory condition.

## 2017-08-05 NOTE — Anesthesia Preprocedure Evaluation (Signed)
Anesthesia Evaluation  Patient identified by MRN, date of birth, ID band Patient awake    Reviewed: Allergy & Precautions, NPO status , Patient's Chart, lab work & pertinent test results  Airway Mallampati: II  TM Distance: >3 FB Neck ROM: Full    Dental no notable dental hx.    Pulmonary Current Smoker,    Pulmonary exam normal breath sounds clear to auscultation       Cardiovascular hypertension, + CAD, + Past MI and + Cardiac Stents  Normal cardiovascular exam Rhythm:Regular Rate:Normal     Neuro/Psych negative neurological ROS  negative psych ROS   GI/Hepatic negative GI ROS, Neg liver ROS,   Endo/Other  negative endocrine ROS  Renal/GU negative Renal ROS  negative genitourinary   Musculoskeletal negative musculoskeletal ROS (+)   Abdominal   Peds negative pediatric ROS (+)  Hematology negative hematology ROS (+)   Anesthesia Other Findings   Reproductive/Obstetrics negative OB ROS                             Anesthesia Physical Anesthesia Plan  ASA: III  Anesthesia Plan: General   Post-op Pain Management:    Induction: Intravenous  PONV Risk Score and Plan: 1 and Ondansetron and Dexamethasone  Airway Management Planned: LMA  Additional Equipment:   Intra-op Plan:   Post-operative Plan: Extubation in OR  Informed Consent: I have reviewed the patients History and Physical, chart, labs and discussed the procedure including the risks, benefits and alternatives for the proposed anesthesia with the patient or authorized representative who has indicated his/her understanding and acceptance.   Dental advisory given  Plan Discussed with: CRNA and Surgeon  Anesthesia Plan Comments:         Anesthesia Quick Evaluation

## 2017-08-05 NOTE — Transfer of Care (Signed)
Immediate Anesthesia Transfer of Care Note  Patient: Adrian Rivera  Procedure(s) Performed: Procedure(s): BIPOLAR TRANSURETHRAL RESECTION OF THE PROSTATE (TURP) (N/A)  Patient Location: PACU  Anesthesia Type:General  Level of Consciousness: sedated  Airway & Oxygen Therapy: Patient Spontanous Breathing and Patient connected to face mask oxygen  Post-op Assessment: Report given to RN and Post -op Vital signs reviewed and stable  Post vital signs: Reviewed and stable  Last Vitals:  Vitals:   08/05/17 0945  BP: 116/85  Pulse: 68  Resp: 16  Temp: 36.9 C  SpO2: 99%    Last Pain:  Vitals:   08/05/17 0945  TempSrc: Oral      Patients Stated Pain Goal: 4 (01/14/35 1224)  Complications: No apparent anesthesia complications

## 2017-08-05 NOTE — H&P (Signed)
Office Visit Report     07/14/2017    Adrian Rivera         MRN: [id number]  PRIMARY CARE:  Glenda Chroman, MD  DOB: Jun 28, 1961, 56 year old Male  REFERRING:  Daleen Bo  SSN:   PROVIDER:  Wendie Simmer, M.D.    LOCATION:  [facility] [phone]    CC: I have urinary retention.  HPI: Adrian Rivera is a 56 year-old male established patient who is here for urinary retention.    8/13: Patient recently presented to the emergency department with bilateral hydronephrosis. He also had a severely distended bladder. His bladder was thick walled and appeared to be trabeculated. He is currently on Flomax for the past 2 years and continues to have significant obstructive voiding complaints including intermittency hesitancy and weak stream and incomplete emptying. He denies any hematuria or dysuria.   The patient's postvoid residual is 4 93 cc. Clean intermittent catheterization was taught to the patient but a catheter was not able to be inserted.   8/23: The patient that his first visit, had to have a cystoscopy with catheter placement over wire. The following day he had some gross hematuria but this improved. He underwent a urodynamics on Monday. Afterwards the catheter again could not be replaced by nursing staff and therefore the catheter was again placed cystoscopically over wire. On review of the urodynamics. Patient had several prolonged involuntary detrusor contractions of high pressure. During voluntary voiding He voids with high bladder pressures with a low flow rate, consistent with bladder outlet obstruction.   A large amount of EMG activity that appears most likely to be artifactual.   ALLERGIES: No Allergies    MEDICATIONS: Lipitor  Lisinopril  Metoprolol Tartrate  Oxybutynin Chloride 5 mg tablet 1 tablet PO Q 8 H as needed for bladder spasms bladder spasms  Plavix  Vesicare 5 mg tablet 1 tablet PO Daily  Flomax 0.4 mg capsule, ext release 24 hr    GU PSH:  Complex cystometrogram, w/ void pressure and urethral pressure profile studies, any technique - 07/11/2017 Complex Uroflow - 07/11/2017 Cystoscopy - 07/11/2017, 07/11/2017, 07/04/2017 Emg surf Electrd - 07/11/2017 Inject For cystogram - 07/11/2017 Insert Bladder Cath; Complex - 07/11/2017 Intrabd voidng Press - 07/11/2017   NON-GU PSH: Appendectomy (open) Cardiac Stent Placement Hernia Repair Leg surgery (unspecified)   GU PMH: BPH w/LUTS, The patient has retention. He presented for urodynamics today. Catheter was replaced. He is also complaining of significant bladder spasms not relieved by Vesicare 5 mg. - 07/11/2017 Gross hematuria - 07/05/2017 Oth hydronephrosis - 07/04/2017 Urinary Retention - 07/04/2017   NON-GU PMH: Hypercholesterolemia Hypertension Myocardial Infarction   FAMILY HISTORY: Cancer - Mother, Father Heart problem - Father Hypertension - Father Kidney Stones - Father   SOCIAL HISTORY: Marital Status: Married Preferred Language: English; Race: White Current Smoking Status: Patient smokes.   Tobacco Use Assessment Completed:  Used Tobacco in last 30 days?  Drinks 3 drinks per week.  Drinks 4+ caffeinated drinks per day. Patient's occupation Brewing technologist.   REVIEW OF SYSTEMS:    GU Review Male:   Patient denies frequent urination, hard to postpone urination, burning/ pain with urination, get up at night to urinate, leakage of urine, stream starts and stops, trouble starting your stream, have to strain to urinate , erection problems, and penile pain.  Gastrointestinal (Upper):   Patient denies nausea, vomiting, and indigestion/ heartburn.  Gastrointestinal (Lower):   Patient denies diarrhea and  constipation.  Constitutional:   Patient denies fever, night sweats, weight loss, and fatigue.  Skin:   Patient denies skin rash/ lesion and itching.  Eyes:   Patient denies blurred vision and double vision.  Ears/ Nose/ Throat:   Patient denies sore throat and  sinus problems.  Hematologic/Lymphatic:   Patient denies swollen glands and easy bruising.  Cardiovascular:   Patient denies leg swelling and chest pains.  Respiratory:   Patient denies cough and shortness of breath.  Endocrine:   Patient denies excessive thirst.  Musculoskeletal:   Patient denies back pain and joint pain.  Neurological:   Patient denies headaches and dizziness.  Psychologic:   Patient denies depression and anxiety.   VITAL SIGNS:      07/14/2017 08:01 AM  BP 123/76 mmHg  Heart Rate 75 /min  Temperature 97.4 F / 36.3 C   GU PHYSICAL EXAMINATION:    Penis: Foley catheter in place draining clear yellow urine  Seminal Vesicles: Nonpalpable.   MULTI-SYSTEM PHYSICAL EXAMINATION:    Constitutional: Well-nourished. No physical deformities. Normally developed. Good grooming.  Respiratory: No labored breathing, no use of accessory muscles.   Cardiovascular: Normal temperature, adequate perfusion  Neurologic / Psychiatric: Oriented to time, oriented to place, oriented to person. No depression, no anxiety, no agitation.  Gastrointestinal: No mass, no tenderness, no rigidity, non obese abdomen.  Musculoskeletal: Normal gait and station of head and neck.    PAST DATA REVIEWED:  Source Of History:  Patient   PROCEDURES:         Renal Ultrasound - 40981  Right Kidney: Length: 12.2 cm Depth:5.1 cm Cortical Width: 0.9 cm Width: 5.8 cm  Left Kidney: Length: 10.6 cm Depth: 5.2 cm Cortical Width:1.5 cm Width: 5.2 cm  Left Kidney/Ureter:  Multiple subcentimeter echogenic foci without shadowing with the largest measuring 0.3 cm in the lower pole ( calcifications vs vessel). ------. There is no significant obvious hydronephrosis..  Right Kidney/Ureter:  ? cortical thinning. ----There is no obvious significant hydronephrosis..  Bladder:  appears to have very thick walled bladder. There is a Foley catheter in place.              Urinalysis w/Scope - 81001 Dipstick Dipstick  Cont'd Micro  Color: Yellow Bilirubin: Neg WBC/hpf: 10 - 20/hpf  Appearance: Clear Ketones: Neg RBC/hpf: 10 - 20/hpf  Specific Gravity: 1.020 Blood: 3+ Bacteria: Rare (0-9/hpf)  pH: 6.0 Protein: 2+ Cystals: NS (Not Seen)  Glucose: Neg Urobilinogen: 0.2 Casts: Hyaline    Nitrites: Neg Trichomonas: Not Present    Leukocyte Esterase: Trace Mucous: Present      Epithelial Cells: NS (Not Seen)      Yeast: NS (Not Seen)      Sperm: Not Present   ASSESSMENT:      ICD-10 Details  1 GU:   Urinary Retention - R33.8   2   BPH w/LUTS - N40.1    PLAN:          Orders Labs PSA, Urine Culture  X-Rays: Renal Ultrasound         Schedule          Document Letter(s):  Created for Patient: Clinical Summary        Notes:   The patient has failed medical management for his lower urinary tract symptoms. He currently has urinary retention and urodynamics revealed findings consistent with bladder outlet obstruction. He would like to proceed with surgical resection. I discussed bipolar transurethral resection of the prostate. I specifically discussed  the risks including but not limited to bleeding which could require blood transfusion, infection, and injury to surrounding structures. Also discussed the possibility that the surgery would not improve symptoms though most men have a great improvement in their symptoms. Also discussed the low likelihood but possibility of development of new symptoms such as irritative voiding symptoms or urinary incontinence. Most men will have some degree of urinary urgency and discomfort immediately following the surgery that resolves in a short amount of time. He understands that most often this is an outpatient procedure but occasionally patients require hospitalization for continuous bladder irrigation in the event of excess bleeding. He also understands the possibility of being sent home with a urethral catheter. The patient expressed understanding and is eager to proceed.    Given his history of hydronephrosis. We will get a renal ultrasound today to ensure resolution, now that he has had adequate time with decompression. This was performed and showed resolution of hydronephrosis.   We will obtain a PSA today for prostate cancer screening prior to proceeding with TURP. If this is elevated, he'll need a prostate biopsy prior to the TURP but if it is normal, we'll proceed as planned.   The patient will need to be cleared to come off his Plavix for an extended amount of time by his primary care physician.   I provided him with a work excuse to be excused from work until this Monday at which point he can resume normal activities.   Signed by Link Snuffer, III, M.D. on 07/19/17 at 2:31 PM (EDT)

## 2017-08-05 NOTE — Interval H&P Note (Signed)
History and Physical Interval Note:  08/05/2017 12:00 PM  Adrian Rivera  has presented today for surgery, with the diagnosis of ENLARGE PROSTATE WITH LOWER URINARY TRACT SYMPTOMS  The various methods of treatment have been discussed with the patient and family. After consideration of risks, benefits and other options for treatment, the patient has consented to  Procedure(s): [place] (TURP) (N/A) as a surgical intervention .  The patient's history has been reviewed, patient examined, no change in status, stable for surgery.  I have reviewed the patient's chart and labs.  Questions were answered to the patient's satisfaction.    Today we specifically discussed the potential risk of permanent retrograde ejaculation. He was okay with this. He also understands the risk of bleeding and infection as well as potential for additional irritative voiding symptoms.. He signed consent for blood transfusion.  Marton Redwood, Rivera

## 2017-08-05 NOTE — Discharge Instructions (Addendum)
Transurethral Resection of the Prostate (TURP) or Greenlight laser ablation of the Prostate ° °Care After ° °Refer to this sheet in the next few weeks. These discharge instructions provide you with general information on caring for yourself after you leave the hospital. Your caregiver may also give you specific instructions. Your treatment has been planned according to the most current medical practices available, but unavoidable complications sometimes occur. If you have any problems or questions after discharge, please call your caregiver. ° °HOME CARE INSTRUCTIONS  ° °Medications °· You may receive medicine for pain management. As your level of discomfort decreases, adjustments in your pain medicines may be made.  °· Take all medicines as directed.  °· You may be given a medicine (antibiotic) to kill germs following surgery. Finish all medicines. Let your caregiver know if you have any side effects or problems from the medicine.  °· If you are on aspirin, it would be best not to restart the aspirin until the blood in the urine clears °Hygiene °· You can take a shower after surgery.  °· You should not take a bath while you still have the urethral catheter. °Activity °· You will be encouraged to get out of bed as much as possible and increase your activity level as tolerated.  °· Spend the first week in and around your home. For 3 weeks, avoid the following:  °· Straining.  °· Running.  °· Strenuous work.  °· Walks longer than a few blocks.  °· Riding for extended periods.  °· Sexual relations.  °· Do not lift heavy objects (more than 20 pounds) for at least 1 month. When lifting, use your arms instead of your abdominal muscles.  °· You will be encouraged to walk as tolerated. Do not exert yourself. Increase your activity level slowly. Remember that it is important to keep moving after an operation of any type. This cuts down on the possibility of developing blood clots.  °· Your caregiver will tell you when you  can resume driving and light housework. Discuss this at your first office visit after discharge. °Diet °· No special diet is ordered after a TURP. However, if you are on a special diet for another medical problem, it should be continued.  °· Normal fluid intake is usually recommended.  °· Avoid alcohol and caffeinated drinks for 2 weeks. They irritate the bladder. Decaffeinated drinks are okay.  °· Avoid spicy foods.  °Bladder Function °· For the first 10 days, empty the bladder whenever you feel a definite desire. Do not try to hold the urine for long periods of time.  °· Urinating once or twice a night even after you are healed is not uncommon.  °· You may see some recurrence of blood in the urine after discharge from the hospital. This usually happens within 2 weeks after the procedure.If this occurs, force fluids again as you did in the hospital and reduce your activity.  °Bowel Function °· You may experience some constipation after surgery. This can be minimized by increasing fluids and fiber in your diet. Drink enough water and fluids to keep your urine clear or pale yellow.  °· A stool softener may be prescribed for use at home. Do not strain to move your bowels.  °· If you are requiring increased pain medicine, it is important that you take stool softeners to prevent constipation. This will help to promote proper healing by reducing the need to strain to move your bowels.  °Sexual Activity °· Semen movement   in the opposite direction and into the bladder (retrograde ejaculation) may occur. Since the semen passes into the bladder, cloudy urine can occur the first time you urinate after intercourse. Or, you may not have an ejaculation during erection. Ask your caregiver when you can resume sexual activity. Retrograde ejaculation and reduced semen discharge should not reduce one's pleasure of intercourse.  °Postoperative Visit °· Arrange the date and time of your after surgery visit with your caregiver.  °Return  to Work °· After your recovery is complete, you will be able to return to work and resume all activities. Your caregiver will inform you when you can return to work.  ° ° °Foley Catheter Care °A soft, flexible tube (Foley catheter) may have been placed in your bladder to drain urine and fluid. Follow these instructions: °Taking Care of the Catheter °· Keep the area where the catheter leaves your body clean.  °· Attach the catheter to the leg so there is no tension on the catheter.  °· Keep the drainage bag below the level of the bladder, but keep it OFF the floor.  °· Do not take long soaking baths. Your caregiver will give instructions about showering.  °· Wash your hands before touching ANYTHING related to the catheter or bag.  °· Using mild soap and warm water on a washcloth:  °· Clean the area closest to the catheter insertion site using a circular motion around the catheter.  °· Clean the catheter itself by wiping AWAY from the insertion site for several inches down the tube.  °· NEVER wipe upward as this could sweep bacteria up into the urethra (tube in your body that normally drains the bladder) and cause infection.  °· Place a Pankonin amount of sterile lubricant at the tip of the penis where the catheter is entering.  °Taking Care of the Drainage Bags °· Two drainage bags may be taken home: a large overnight drainage bag, and a smaller leg bag which fits underneath clothing.  °· It is okay to wear the overnight bag at any time, but NEVER wear the smaller leg bag at night.  °· Keep the drainage bag well below the level of your bladder. This prevents backflow of urine into the bladder and allows the urine to drain freely.  °· Anchor the tubing to your leg to prevent pulling or tension on the catheter. Use tape or a leg strap provided by the hospital.  °· Empty the drainage bag when it is 1/2 to 3/4 full. Wash your hands before and after touching the bag.  °· Periodically check the tubing for kinks to make sure  there is no pressure on the tubing which could restrict the flow of urine.  °Changing the Drainage Bags °· Cleanse both ends of the clean bag with alcohol before changing.  °· Pinch off the rubber catheter to avoid urine spillage during the disconnection.  °· Disconnect the dirty bag and connect the clean one.  °· Empty the dirty bag carefully to avoid a urine spill.  °· Attach the new bag to the leg with tape or a leg strap.  °Cleaning the Drainage Bags °· Whenever a drainage bag is disconnected, it must be cleaned quickly so it is ready for the next use.  °· Wash the bag in warm, soapy water.  °· Rinse the bag thoroughly with warm water.  °· Soak the bag for 30 minutes in a solution of white vinegar and water (1 cup vinegar to 1 quart warm   water).   Rinse with warm water.  SEEK MEDICAL CARE IF:   You have chills or night sweats.   You are leaking around your catheter or have problems with your catheter. It is not uncommon to have sporadic leakage around your catheter as a result of bladder spasms. If the leakage stops, there is not much need for concern. If you are uncertain, call your caregiver.   You develop side effects that you think are coming from your medicines.  SEEK IMMEDIATE MEDICAL CARE IF:   You are suddenly unable to urinate. Check to see if there are any kinks in the drainage tubing that may cause this. If you cannot find any kinks, call your caregiver immediately. This is an emergency.   You develop shortness of breath or chest pains.   Bleeding persists or clots develop in your urine.   You have a fever.   You develop pain in your back or over your lower belly (abdomen).   You develop pain or swelling in your legs.   Any problems you are having get worse rather than better.  MAKE SURE YOU:   Understand these instructions.   Will watch your condition.   Will get help right away if you are not doing well or get worse.     Indwelling Urinary Catheter Insertion, Care  After This sheet gives you information about how to care for yourself after your procedure. Your health care provider may also give you more specific instructions. If you have problems or questions, contact your health care provider. What can I expect after the procedure? After the procedure, it is common to have:  Slight discomfort around your urethra where the catheter enters your body.  Follow these instructions at home:  Keep the drainage bag at or below the level of your bladder. Doing this ensures that urine can only drain out, not back into your body.  Secure the catheter tubing and drainage bag to your leg or thigh to keep it from moving.  Check the catheter tubing regularly to make sure there are no kinks or blockages.  Take showers daily to keep the catheter clean. Do not take a bath.  Do not pull on your catheter or try to remove it.  Disconnect the tubing and drainage bag as little as possible.  Empty the drainage bag every 2-4 hours, or more often if needed. Do not let the bag get completely full.  Wash your hands with soap and water before and after touching the catheter, tubing, or drainage bag.  Do not let the drainage bag or catheter tubing touch the floor.  Drink enough fluids to keep your urine clear or pale yellow, or as told by your health care provider. Contact a health care provider if:  Urine stops flowing into the drainage bag.  You feel pain or pressure in the bladder area.  You have back pain.  Your catheter gets clogged.  Your catheter starts to leak.  Your urine looks cloudy.  Your drainage bag or tubing looks dirty.  You notice a bad smell when emptying your drainage bag. Get help right away if:  You have a fever or chills.  You have severe pain in your back or your lower abdomen.  You have warmth, redness, swelling, or pain in the urethra area.  You notice blood in your urine.  Your catheter gets pulled out. Summary  Do not pull  on your catheter or try to remove it.  Keep the drainage  bag at or below the level of your bladder, but do not let the drainage bag or catheter tubing touch the floor.  Wash your hands with soap and water before and after touching the catheter, tubing, or drainage bag.  Contact your health care provider if you have a fever, chills, or any other signs of infection. This information is not intended to replace advice given to you by your health care provider. Make sure you discuss any questions you have with your health care provider. Document Released: 12/18/2016 Document Revised: 12/18/2016 Document Reviewed: 12/18/2016 Elsevier Interactive Patient Education  2018 [employer] Anesthesia, Adult, Care After These instructions provide you with information about caring for yourself after your procedure. Your health care provider may also give you more specific instructions. Your treatment has been planned according to current medical practices, but problems sometimes occur. Call your health care provider if you have any problems or questions after your procedure. What can I expect after the procedure? After the procedure, it is common to have:  Vomiting.  A sore throat.  Mental slowness.  It is common to feel:  Nauseous.  Cold or shivery.  Sleepy.  Tired.  Sore or achy, even in parts of your body where you did not have surgery.  Follow these instructions at home: For at least 24 hours after the procedure:  Do not: ? Participate in activities where you could fall or become injured. ? Drive. ? Use heavy machinery. ? Drink alcohol. ? Take sleeping pills or medicines that cause drowsiness. ? Make important decisions or sign legal documents. ? Take care of children on your own.  Rest. Eating and drinking  If you vomit, drink water, juice, or soup when you can drink without vomiting.  Drink enough fluid to keep your urine clear or pale yellow.  Make sure you  have little or no nausea before eating solid foods.  Follow the diet recommended by your health care provider. General instructions  Have a responsible adult stay with you until you are awake and alert.  Return to your normal activities as told by your health care provider. Ask your health care provider what activities are safe for you.  Take over-the-counter and prescription medicines only as told by your health care provider.  If you smoke, do not smoke without supervision.  Keep all follow-up visits as told by your health care provider. This is important. Contact a health care provider if:  You continue to have nausea or vomiting at home, and medicines are not helpful.  You cannot drink fluids or start eating again.  You cannot urinate after 8-12 hours.  You develop a skin rash.  You have fever.  You have increasing redness at the site of your procedure. Get help right away if:  You have difficulty breathing.  You have chest pain.  You have unexpected bleeding.  You feel that you are having a life-threatening or urgent problem. This information is not intended to replace advice given to you by your health care provider. Make sure you discuss any questions you have with your health care provider. Document Released: 02/14/2001 Document Revised: 04/12/2016 Document Reviewed: 10/23/2015 Elsevier Interactive Patient Education  [employer].

## 2017-08-12 DIAGNOSIS — R338 Other retention of urine: Secondary | ICD-10-CM | POA: Diagnosis not present

## 2017-08-12 DIAGNOSIS — N401 Enlarged prostate with lower urinary tract symptoms: Secondary | ICD-10-CM | POA: Diagnosis not present

## 2017-08-15 NOTE — Anesthesia Postprocedure Evaluation (Signed)
Anesthesia Post Note  Patient: Adrian Rivera  Procedure(s) Performed: Procedure(s) (LRB): BIPOLAR TRANSURETHRAL RESECTION OF THE PROSTATE (TURP) (N/A)     Patient location during evaluation: PACU Anesthesia Type: General Level of consciousness: awake and alert Pain management: pain level controlled Vital Signs Assessment: post-procedure vital signs reviewed and stable Respiratory status: spontaneous breathing, nonlabored ventilation, respiratory function stable and patient connected to nasal cannula oxygen Cardiovascular status: blood pressure returned to baseline and stable Postop Assessment: no apparent nausea or vomiting Anesthetic complications: no    Last Vitals:  Vitals:   08/05/17 1423 08/05/17 1455  BP: (!) 125/4 118/87  Pulse: (!) 56 80  Resp: 17 16  Temp: 36.7 C (!) 36.3 C  SpO2: 96% 100%    Last Pain:  Vitals:   08/05/17 1455  TempSrc: Oral  PainSc: 2                  Oveta Idris S

## 2017-09-14 DIAGNOSIS — N401 Enlarged prostate with lower urinary tract symptoms: Secondary | ICD-10-CM | POA: Diagnosis not present

## 2017-09-14 DIAGNOSIS — N486 Induration penis plastica: Secondary | ICD-10-CM | POA: Diagnosis not present

## 2017-09-14 DIAGNOSIS — R3914 Feeling of incomplete bladder emptying: Secondary | ICD-10-CM | POA: Diagnosis not present

## 2017-11-21 ENCOUNTER — Other Ambulatory Visit: Payer: Self-pay | Admitting: *Deleted

## 2017-11-21 MED ORDER — METOPROLOL TARTRATE 25 MG PO TABS: ORAL_TABLET | ORAL | 0 refills | Status: DC

## 2017-11-21 MED ORDER — ATORVASTATIN CALCIUM 40 MG PO TABS: 40.0000 mg | ORAL_TABLET | Freq: Every day | ORAL | 0 refills | Status: DC

## 2017-12-28 ENCOUNTER — Other Ambulatory Visit: Payer: Self-pay | Admitting: Cardiovascular Disease

## 2018-01-31 ENCOUNTER — Other Ambulatory Visit: Payer: Self-pay | Admitting: Cardiovascular Disease

## 2018-02-21 ENCOUNTER — Other Ambulatory Visit: Payer: Self-pay | Admitting: Cardiovascular Disease

## 2018-03-28 ENCOUNTER — Other Ambulatory Visit: Payer: Self-pay | Admitting: Cardiovascular Disease

## 2018-03-29 ENCOUNTER — Other Ambulatory Visit: Payer: Self-pay | Admitting: Cardiovascular Disease

## 2018-04-04 ENCOUNTER — Other Ambulatory Visit: Payer: Self-pay

## 2018-04-04 ENCOUNTER — Encounter: Payer: Self-pay | Admitting: Cardiovascular Disease

## 2018-04-04 ENCOUNTER — Ambulatory Visit (INDEPENDENT_AMBULATORY_CARE_PROVIDER_SITE_OTHER): Payer: BLUE CROSS/BLUE SHIELD | Admitting: Cardiovascular Disease

## 2018-04-04 VITALS — BP 99/67 | HR 76 | Ht 70.0 in | Wt 164.0 lb

## 2018-04-04 DIAGNOSIS — I10 Essential (primary) hypertension: Secondary | ICD-10-CM

## 2018-04-04 DIAGNOSIS — Z720 Tobacco use: Secondary | ICD-10-CM | POA: Diagnosis not present

## 2018-04-04 DIAGNOSIS — E785 Hyperlipidemia, unspecified: Secondary | ICD-10-CM | POA: Diagnosis not present

## 2018-04-04 DIAGNOSIS — I25118 Atherosclerotic heart disease of native coronary artery with other forms of angina pectoris: Secondary | ICD-10-CM | POA: Diagnosis not present

## 2018-04-04 MED ORDER — ATORVASTATIN CALCIUM 40 MG PO TABS
40.0000 mg | ORAL_TABLET | Freq: Every day | ORAL | 3 refills | Status: DC
Start: 2018-04-04 — End: 2018-05-31

## 2018-04-04 MED ORDER — CLOPIDOGREL BISULFATE 75 MG PO TABS: 75.0000 mg | ORAL_TABLET | Freq: Every day | ORAL | 3 refills | Status: DC

## 2018-04-04 MED ORDER — METOPROLOL TARTRATE 25 MG PO TABS: 25.0000 mg | ORAL_TABLET | Freq: Two times a day (BID) | ORAL | 3 refills | Status: DC

## 2018-04-04 MED ORDER — NITROGLYCERIN 0.4 MG SL SUBL: 0.4000 mg | SUBLINGUAL_TABLET | SUBLINGUAL | 3 refills | Status: DC | PRN

## 2018-04-04 MED ORDER — LISINOPRIL 20 MG PO TABS: 20.0000 mg | ORAL_TABLET | Freq: Every day | ORAL | 3 refills | Status: DC

## 2018-04-04 NOTE — Patient Instructions (Signed)
Medication Instructions:  Continue all current medications.  Labwork:  Lipids - order given today.   Reminder:  Nothing to eat or drink after 12 midnight prior to labs.  Office will contact with results via phone or letter.     Testing/Procedures: none  Follow-Up: Your physician wants you to follow up in:  1 year.  You will receive a reminder letter in the mail one-two months in advance.  If you don't receive a letter, please call our office to schedule the follow up appointment.    Any Other Special Instructions Will Be Listed Below (If Applicable).  If you need a refill on your cardiac medications before your next appointment, please call your pharmacy.  

## 2018-04-04 NOTE — Progress Notes (Signed)
SUBJECTIVE: [name] presents for a followup visit. He has a known history of coronary artery disease status post angioplasty and bare-metal stent placement to the LAD and RCA in 2000.   He then presented in February 2013 to [facility] with a non-ST elevation myocardial infarction. He underwent cardiac catheterization which showed an occluded RCA at the previously placed stent with left-to-right collaterals. The LAD had 50% proximal stenosis. The stent in the mid segment was patent with minimal restenosis. The left circumflex had an ulcerated 80% stenosis in the midsegment as well as 80% stenosis in OM 3. The patient underwent angioplasty and drug-eluting stent placement to both OM 3 and left circumflex. Ejection fraction was normal.   There have been issues with medication compliance in the past, as well as requesting Xanax.  The patient denies any symptoms of chest pain, palpitations, shortness of breath, lightheadedness, dizziness, leg swelling, orthopnea, PND, and syncope.  He wants to quit smoking but does not want to try Chantix or nicotine patches.  He also said trying to quit makes him very anxious.    Soc Hx: His wife has multiple sclerosis. They have a daughter, Adrian Rivera. 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   Review of Systems: As per "subjective", otherwise negative.  Allergies  Allergen Reactions  . Cephalexin Hives and Rash    Current Outpatient Medications  Medication Sig Dispense Refill  . aspirin EC 81 MG tablet Take 81 mg by mouth daily.    Marland Kitchen atorvastatin (LIPITOR) 40 MG tablet TAKE (1) TABLET BY MOUTH ONCE DAILY. 30 tablet 0  . clopidogrel (PLAVIX) 75 MG tablet TAKE (1) TABLET BY MOUTH ONCE DAILY. 30 tablet 0  . lisinopril (PRINIVIL,ZESTRIL) 20 MG tablet TAKE (1) TABLET BY MOUTH ONCE DAILY. 30 tablet 0  . metoprolol tartrate (LOPRESSOR) 25 MG tablet TAKE (1) TABLET TWICE DAILY. 60 tablet 0  . nitroGLYCERIN (NITROSTAT) 0.4 MG SL  tablet Place 1 tablet (0.4 mg total) under the tongue every 5 (five) minutes x 3 doses as needed for chest pain. 25 tablet 3   No current facility-administered medications for this visit.     Past Medical History:  Diagnosis Date  . Angina   . CAD (coronary artery disease)    cath 2000. Occluded LAD and significant RCA stenosis. BMS to LAD and RCA. NSTEMI in 12/2011: cath showed occluded RCA stent, 50% proximal LAD stenosis, patent stent, 80% mid LCX and 80% OM3. Had DES placement to both OM3 and LCX.   . Enlarged prostate    last 2 years  . Foley catheter in place 07/21/2017  . Hyperlipidemia   . Hypertension   . Myocardial infarction ([place]) ago 30 and age 88  . Opioid dependence ([place])   . Rash    healing on both legs from keflex occured 1 week ago  . Tobacco use     Past Surgical History:  Procedure Laterality Date  . APPENDECTOMY    . CORONARY ANGIOPLASTY  10 yrs ago  . EYE SURGERY Bilateral 20 yrs ago   lasix both eyes   . HERNIA REPAIR    . LEFT HEART CATHETERIZATION WITH CORONARY ANGIOGRAM N/A 01/05/2012   Procedure: LEFT HEART CATHETERIZATION WITH CORONARY ANGIOGRAM;  Surgeon: Burnell Blanks, MD;  Location: [facility] CATH LAB;  Service: Cardiovascular;  Laterality: N/A;  . PERCUTANEOUS CORONARY STENT INTERVENTION (PCI-S)  01/05/2012   Procedure: PERCUTANEOUS CORONARY STENT INTERVENTION (PCI-S);  Surgeon: Burnell Blanks, MD;  Location: [facility]  CATH LAB;  Service: Cardiovascular;;  . right leg surgery with steel rod in place tibula and fibula  2010  . TRANSURETHRAL RESECTION OF PROSTATE N/A 08/05/2017   Procedure: BIPOLAR TRANSURETHRAL RESECTION OF THE PROSTATE (TURP);  Surgeon: Lucas Mallow, MD;  Location: [facility] ORS;  Service: Urology;  Laterality: N/A;    Social History   Socioeconomic History  . Marital status: Married    Spouse name: Not on file  . Number of children: Not on file  . Years of education: Not on file  . Highest education level: Not on file    Occupational History  . Not on file  Social Needs  . Financial resource strain: Not on file  . Food insecurity:    Worry: Not on file    Inability: Not on file  . Transportation needs:    Medical: Not on file    Non-medical: Not on file  Tobacco Use  . Smoking status: Current Every Day Smoker    Packs/day: 1.00    Years: 35.00    Pack years: 35.00    Types: Cigarettes    Start date: 06/05/1976  . Smokeless tobacco: Never Used  Substance and Sexual Activity  . Alcohol use: No    Alcohol/week: 4.8 oz    Types: 8 Cans of beer per week  . Drug use: Yes    Frequency: 6.0 times per week    Types: Hydromorphone, Opium    Comment: used xnax no prescription for few days ago, last used hydromorphone and opium 8 yrs ago  . Sexual activity: Yes  Lifestyle  . Physical activity:    Days per week: Not on file    Minutes per session: Not on file  . Stress: Not on file  Relationships  . Social connections:    Talks on phone: Not on file    Gets together: Not on file    Attends religious service: Not on file    Active member of club or organization: Not on file    Attends meetings of clubs or organizations: Not on file    Relationship status: Not on file  . Intimate partner violence:    Fear of current or ex partner: Not on file    Emotionally abused: Not on file    Physically abused: Not on file    Forced sexual activity: Not on file  Other Topics Concern  . Not on file  Social History Narrative  . Not on file     Vitals:   04/04/18 1556  BP: 99/67  Pulse: 76  SpO2: 98%  Weight: 164 lb (74.4 kg)  Height: 5\' 10"  (1.778 m)    Wt Readings from Last 3 Encounters:  04/04/18 164 lb (74.4 kg)  08/05/17 164 lb (74.4 kg)  07/29/17 164 lb (74.4 kg)     PHYSICAL EXAM General: NAD HEENT: Normal. Neck: No JVD, no thyromegaly. Lungs: Clear to auscultation bilaterally with normal respiratory effort. CV: Regular rate and rhythm, normal S1/S2, no S3/S4, no murmur. No pretibial  or periankle edema.  No carotid bruit.   Abdomen: Soft, nontender, no distention.  Neurologic: Alert and oriented.  Psych: Normal affect. Skin: Normal. Musculoskeletal: No gross deformities.    ECG: Most recent ECG reviewed.   Labs: Lab Results  Component Value Date/Time   K 4.6 07/29/2017 09:11 AM   BUN 19 07/29/2017 09:11 AM   CREATININE 1.04 07/29/2017 09:11 AM   HGB 15.3 07/29/2017 09:11 AM     Lipids:  Lab Results  Component Value Date/Time   LDLCALC 113 (H) 01/06/2012 05:00 AM   CHOL 195 01/06/2012 05:00 AM   TRIG 149 01/06/2012 05:00 AM   HDL 52 01/06/2012 05:00 AM       ASSESSMENT AND PLAN: 1. CAD: Symptomatically stable. Continue Lipitor, ASA, and Plavix (will continue DAPT indefinitely), lisinopril, and metoprolol.  I will refill all cardiac medications.  2. Essential HTN: Controlled. No changes.  3. Hyperlipidemia: Will check lipids. Continue Lipitor.  4.  Tobacco abuse: He wants to quit smoking but does not want to try Chantix or nicotine patches.  He also said trying to quit makes him very anxious.    Disposition: Follow up 1 year.   Kate Sable, M.D., F.A.C.C.

## 2018-04-05 ENCOUNTER — Encounter: Payer: Self-pay | Admitting: *Deleted

## 2018-05-31 ENCOUNTER — Other Ambulatory Visit: Payer: Self-pay | Admitting: Cardiovascular Disease

## 2018-08-22 DIAGNOSIS — I10 Essential (primary) hypertension: Secondary | ICD-10-CM | POA: Diagnosis not present

## 2018-08-22 DIAGNOSIS — M549 Dorsalgia, unspecified: Secondary | ICD-10-CM | POA: Diagnosis not present

## 2018-08-22 DIAGNOSIS — Z6822 Body mass index (BMI) 22.0-22.9, adult: Secondary | ICD-10-CM | POA: Diagnosis not present

## 2018-08-22 DIAGNOSIS — Z713 Dietary counseling and surveillance: Secondary | ICD-10-CM | POA: Diagnosis not present

## 2018-08-22 DIAGNOSIS — Z299 Encounter for prophylactic measures, unspecified: Secondary | ICD-10-CM | POA: Diagnosis not present

## 2018-09-29 IMAGING — CT CT RENAL STONE PROTOCOL
2 of 4 series · 15 of 46 positions shown, 17 images · non-contrast
Comparison: None.

CLINICAL DATA: Acute onset of bilateral flank pain, worse on the
left. Initial encounter.

EXAM:
CT ABDOMEN AND PELVIS WITHOUT CONTRAST
TECHNIQUE: Multidetector CT imaging of the abdomen and pelvis was performed
following the standard protocol without IV contrast.

[Series 2: axial st · axial · 0.70mm/px · z∈[-482,-42]mm · 12 of 98 slices shown, 14 images]
[im 5/98  soft-tissue]
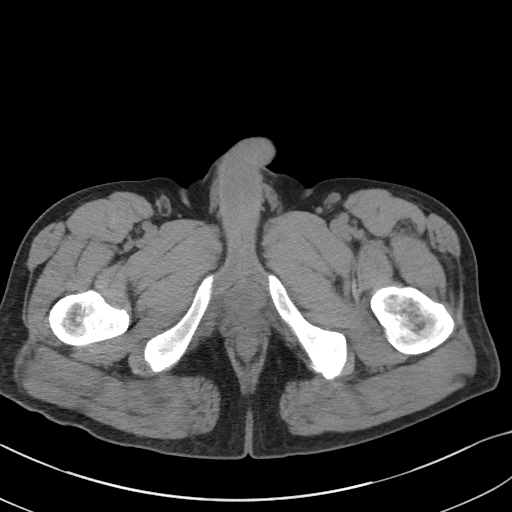
[im 5/98  bone]
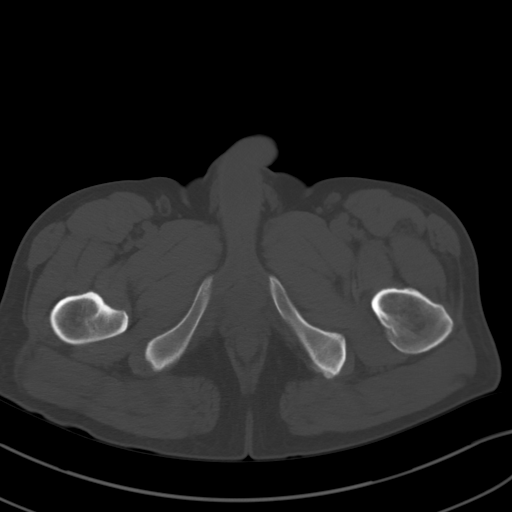
[im 13/98  soft-tissue]
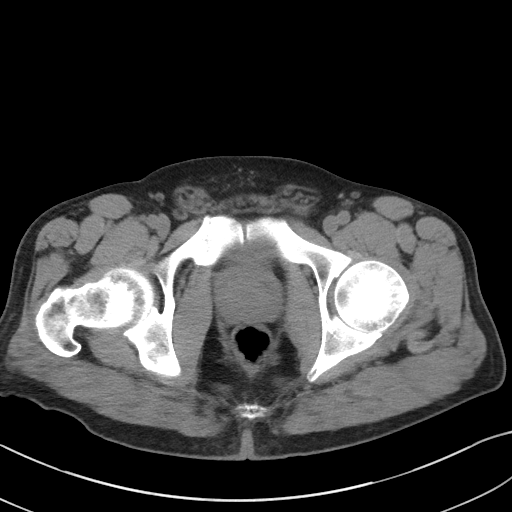
[im 22/98  soft-tissue]
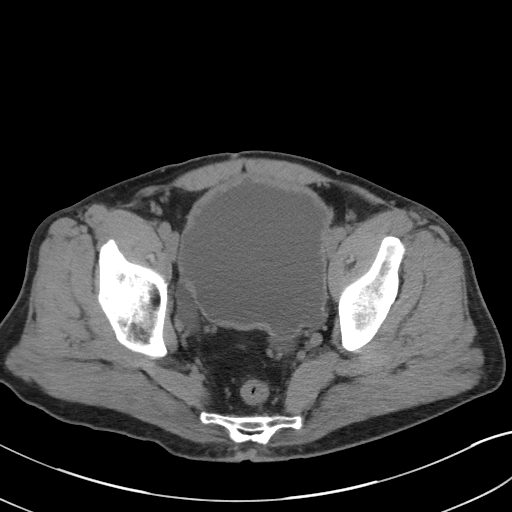
[im 30/98  soft-tissue]
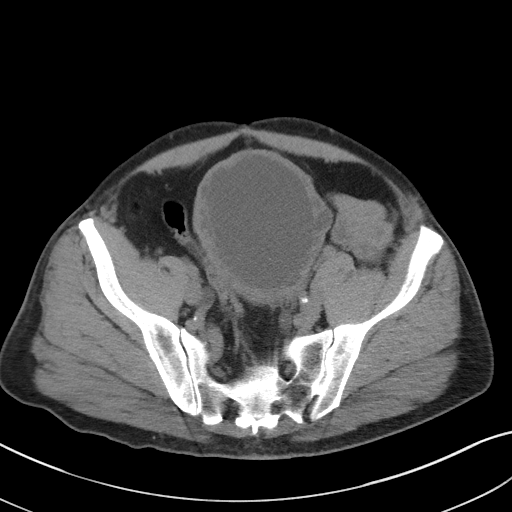
[im 38/98  soft-tissue]
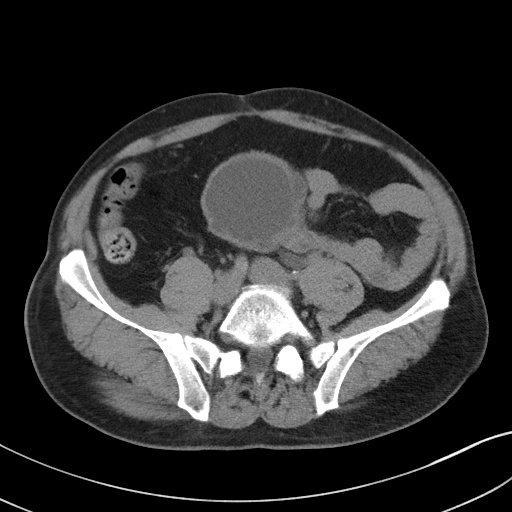
[im 47/98  soft-tissue]
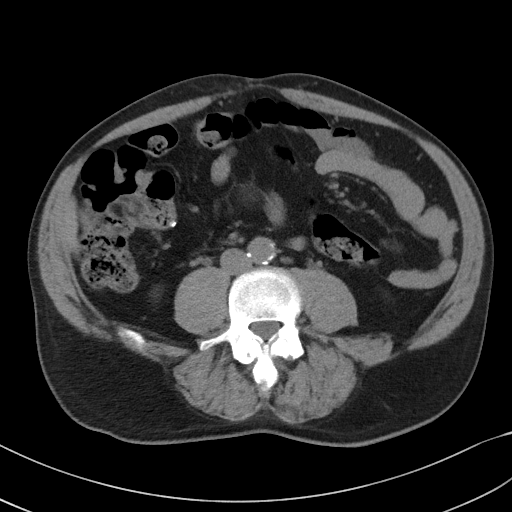
[im 51/98  soft-tissue]
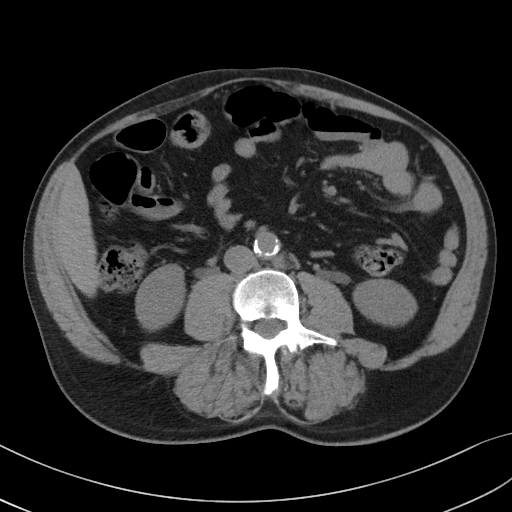
[im 60/98  soft-tissue]
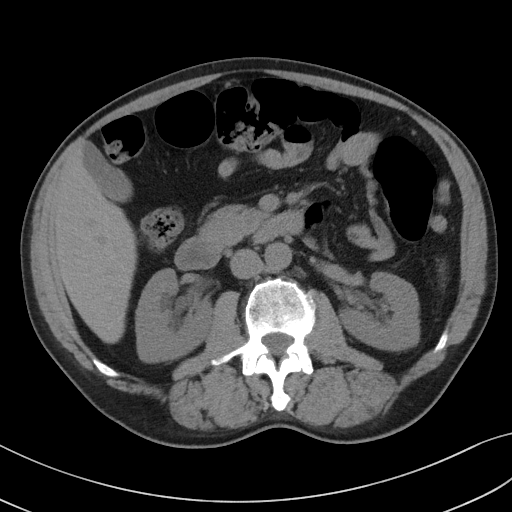
[im 68/98  soft-tissue]
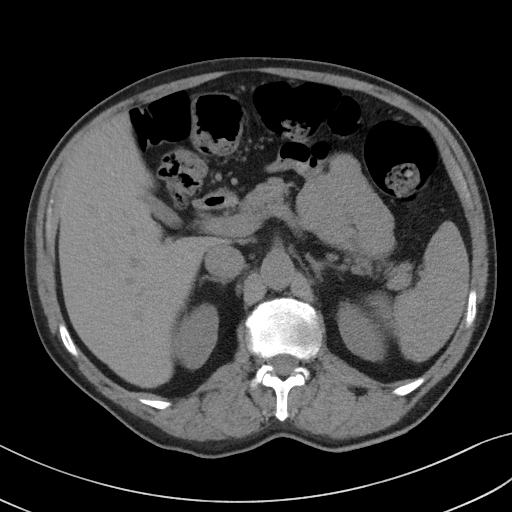
[im 68/98  bone]
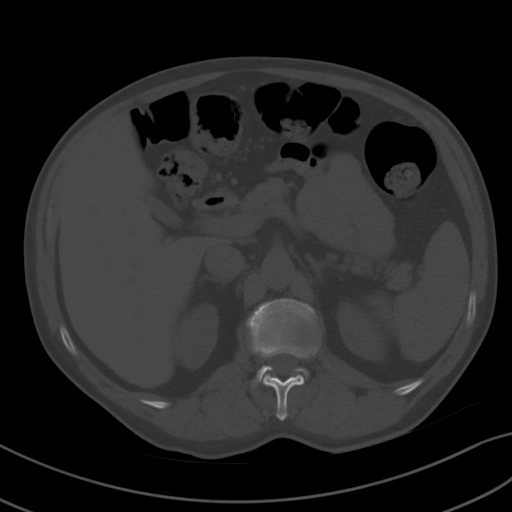
[im 76/98  soft-tissue]
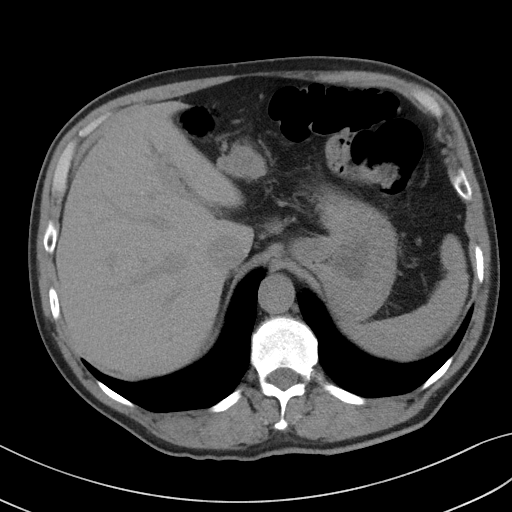
[im 85/98  soft-tissue]
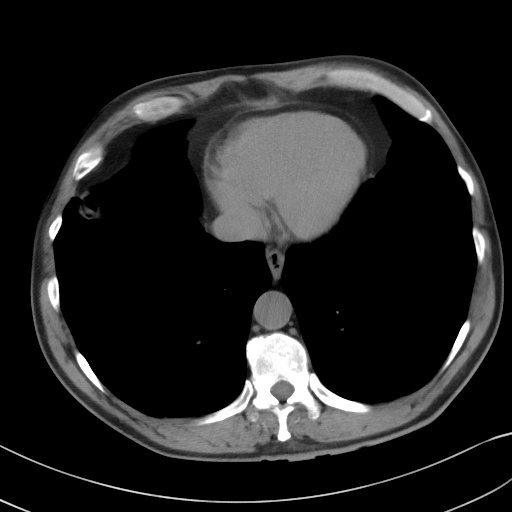
[im 93/98  soft-tissue]
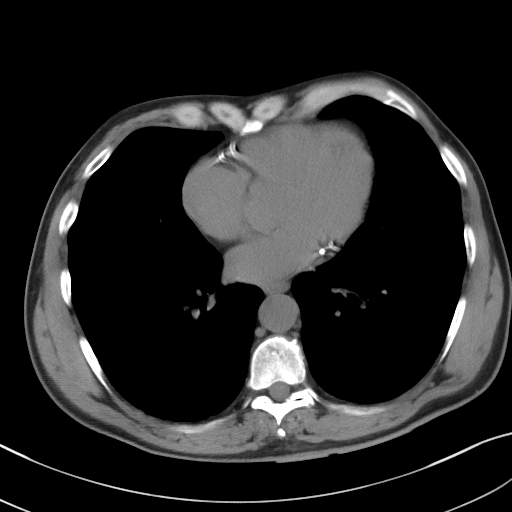

[Series 5: coronal st · coronal · 0.82mm/px · 3 of 90 slices shown]
[im 30/90  soft-tissue]
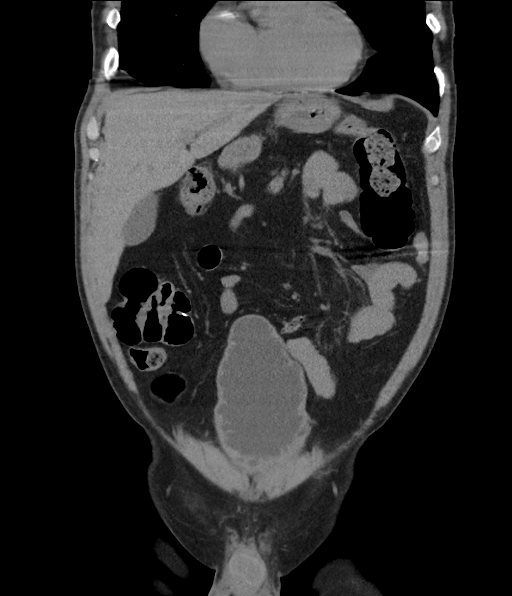
[im 40/90  soft-tissue]
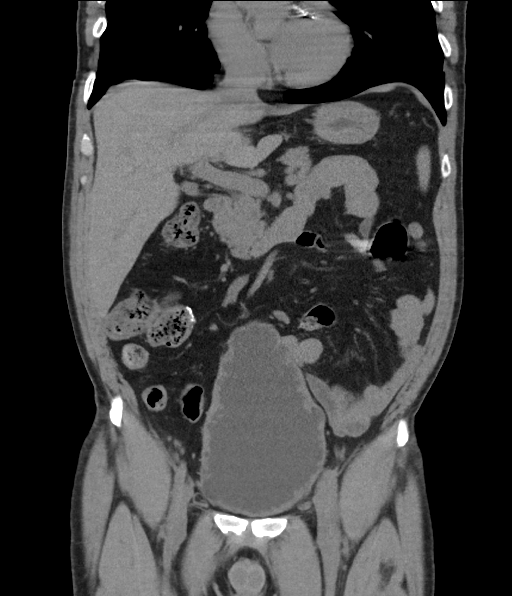
[im 50/90  soft-tissue]
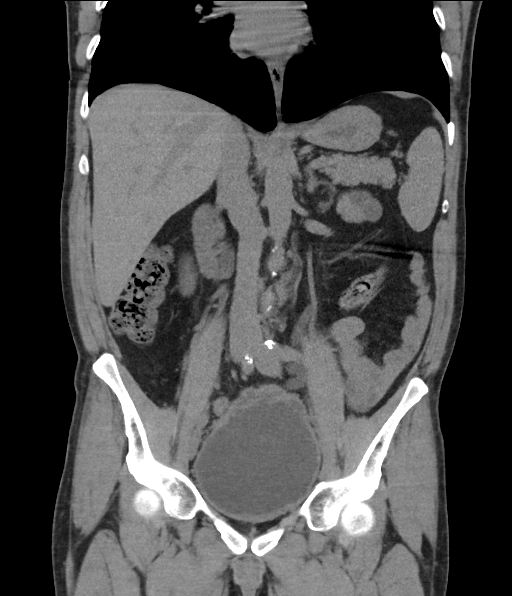

[15 of 46 positions shown; findings below may reference images not displayed]

FINDINGS: Lower chest: Diffuse coronary artery calcifications are seen. The
visualized lung bases are relatively clear.

Hepatobiliary: The liver is unremarkable in appearance. The
gallbladder is unremarkable in appearance. The common bile duct
remains normal in caliber.

Pancreas: The pancreas is within normal limits.

Spleen: The spleen is unremarkable in appearance.

Adrenals/Urinary Tract: The adrenal glands are unremarkable in
appearance.

Mild bilateral hydronephrosis noted, more prominent on the right,
without evidence of distal obstructing stone. This may reflect
chronic bladder outlet obstruction given the appearance of the
bladder. No perinephric stranding is seen. No renal or ureteral
stones are identified.

Stomach/Bowel: The stomach is unremarkable in appearance. The small
bowel is within normal limits. The patient is status post
appendectomy. The colon is unremarkable in appearance.

Vascular/Lymphatic: There is ectasia of the infrarenal abdominal
aorta, measuring up to 2.9 cm in AP dimension, without evidence of
aneurysmal dilatation. Scattered calcification is seen along the
abdominal aorta and its branches. No retroperitoneal or pelvic
sidewall lymphadenopathy is seen.

Reproductive: The bladder is significantly distended, with diffuse
irregular bladder wall thickening, concerning for neurogenic bladder
or chronic inflammation. The prostate is borderline normal in size.

Other: No additional soft tissue abnormalities are seen.

Musculoskeletal: No acute osseous abnormalities are identified. The
visualized musculature is unremarkable in appearance.
IMPRESSION: 1. Mild bilateral hydronephrosis, more more prominent on the right,
without evidence of distal obstructing stone. This may reflect
chronic bladder outlet obstruction given the appearance of the
bladder.
2. Significantly distended bladder, with diffuse irregular bladder
wall thickening, concerning for neurogenic bladder or chronic
inflammation.
3. Scattered aortic atherosclerosis. Ectatic abdominal aorta at risk
for aneurysm development. Recommend followup by ultrasound in 5
years. This recommendation follows ACR consensus guidelines: White
Paper of the ACR Incidental Findings Committee II on Vascular
Findings. [HOSPITAL] 4750; [DATE].
4. Diffuse coronary artery calcifications seen.

## 2019-01-17 ENCOUNTER — Other Ambulatory Visit: Payer: Self-pay | Admitting: Cardiovascular Disease

## 2019-01-28 IMAGING — US US ART/VEN ABD/PELV/SCROTUM DOPPLER LTD
1 series · 14 of 25 positions shown · non-contrast
Comparison: CT abdomen and pelvis June 30, 2017

CLINICAL DATA: LEFT testicular pain for 1 day.

EXAM:
SCROTAL ULTRASOUND
DOPPLER ULTRASOUND OF THE TESTICLES
TECHNIQUE: Complete ultrasound examination of the testicles, epididymis, and
other scrotal structures was performed. Color and spectral Doppler
ultrasound were also utilized to evaluate blood flow to the
testicles.

[Series 1: us art/ven abd/pelv/scrotum doppler ltd · 0.06mm/px · 14 of 57 slices shown]
[im 1/57]
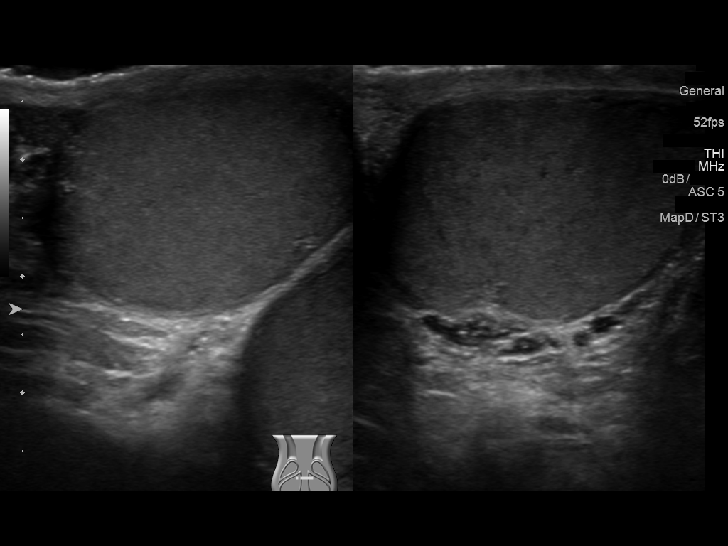
[im 5/57]
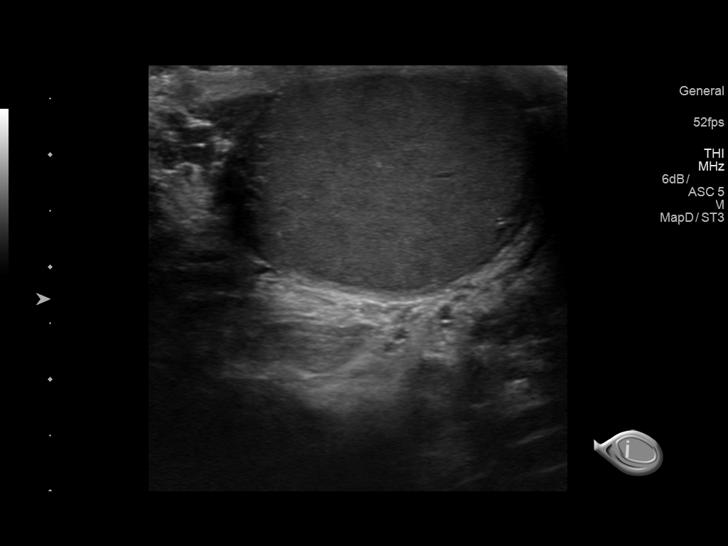
[im 10/57]
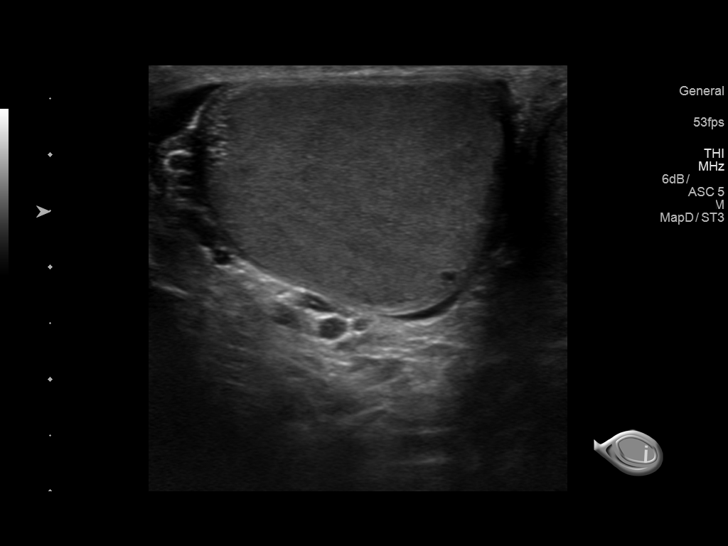
[im 15/57]
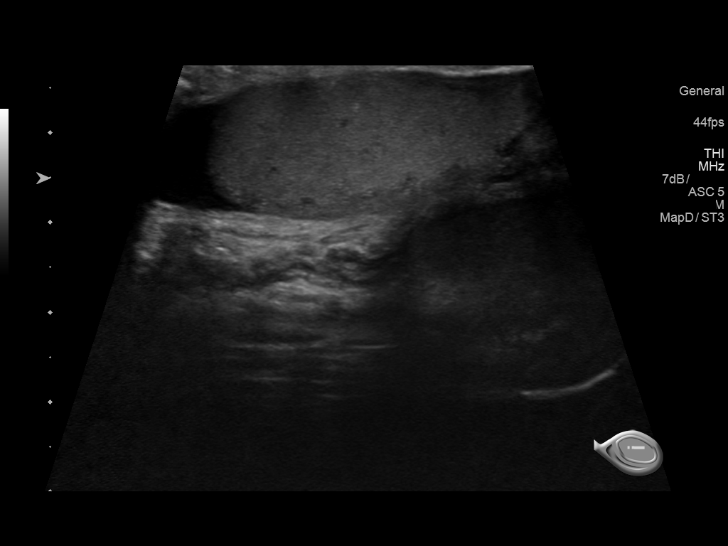
[im 19/57]
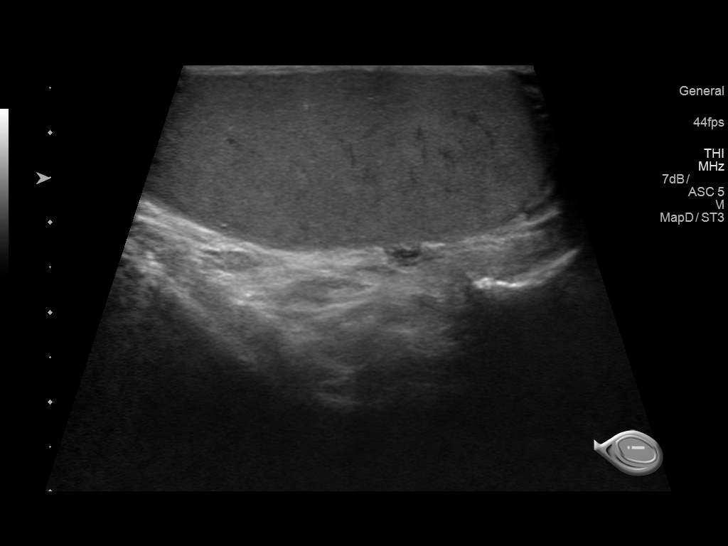
[im 22/57]
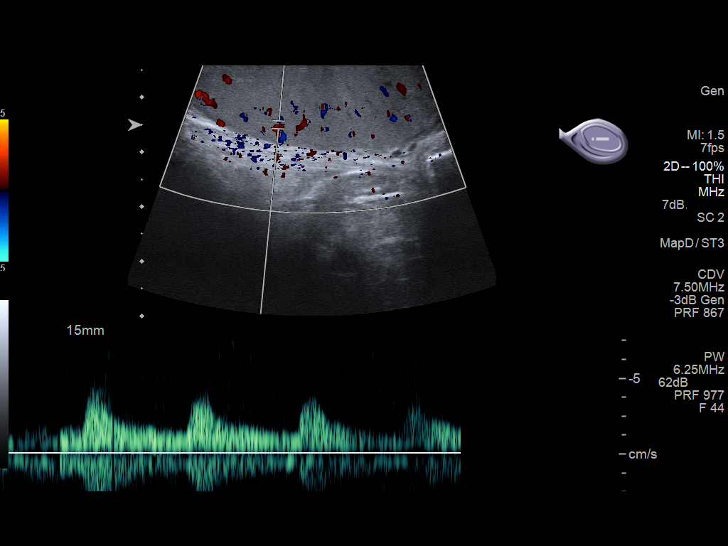
[im 26/57]
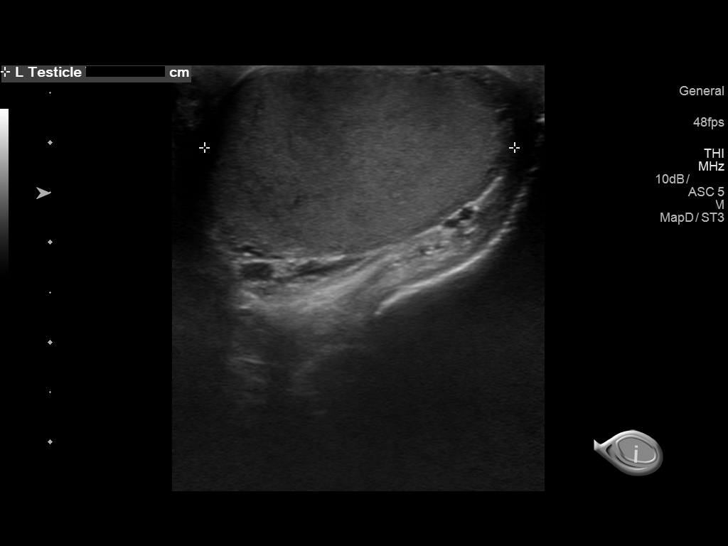
[im 31/57]
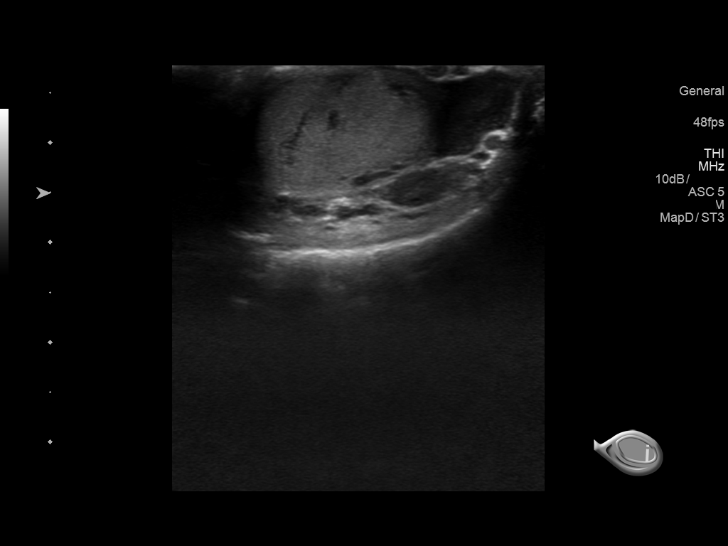
[im 36/57]
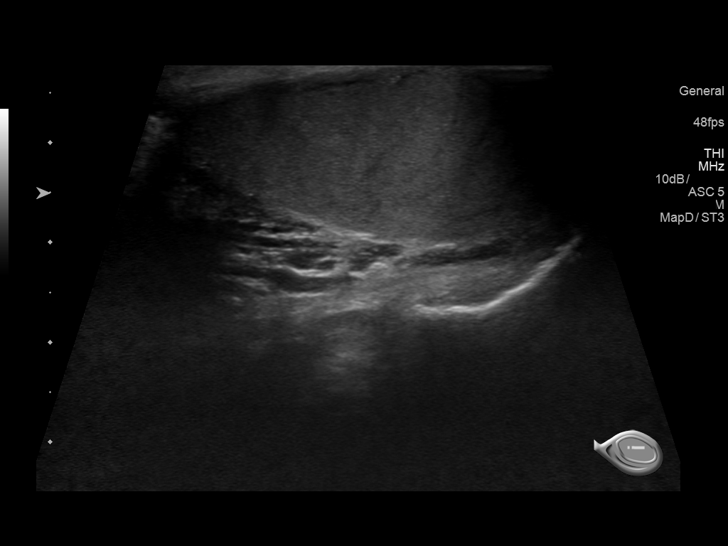
[im 38/57]
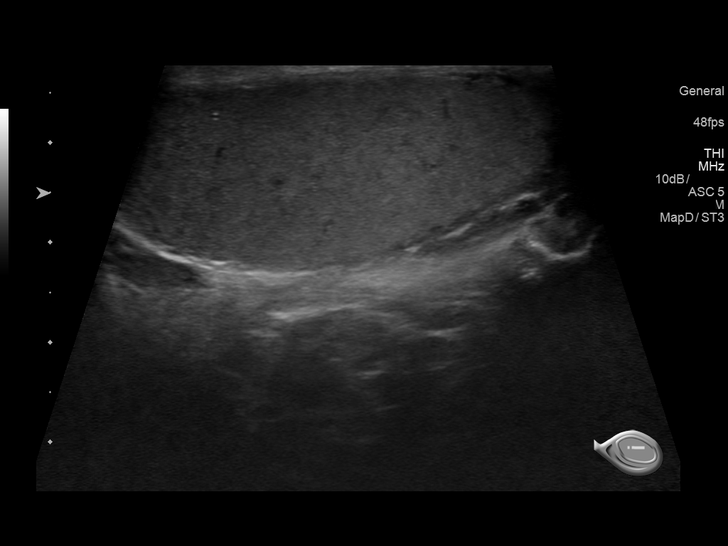
[im 43/57]
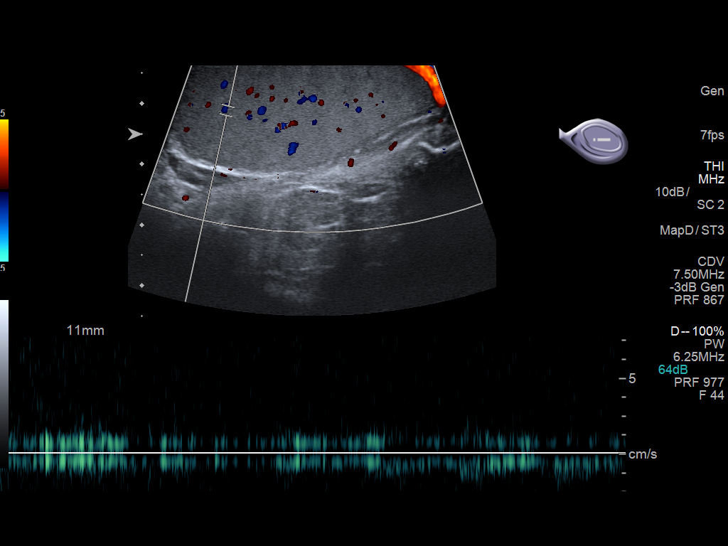
[im 47/57]
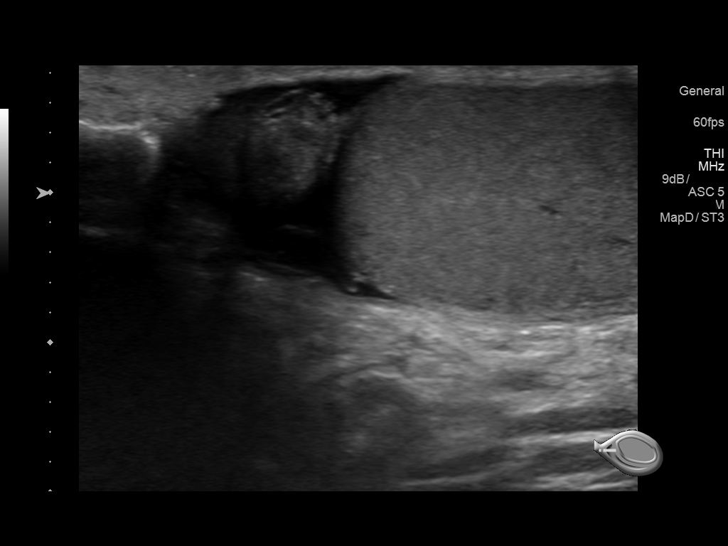
[im 52/57]
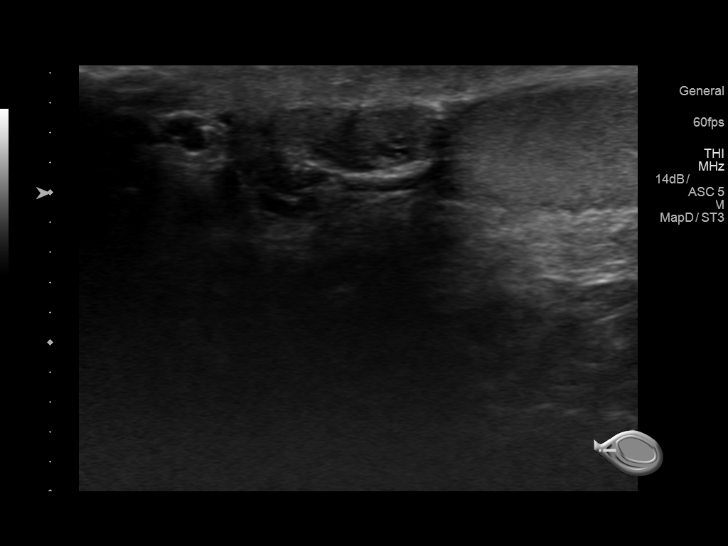
[im 57/57]
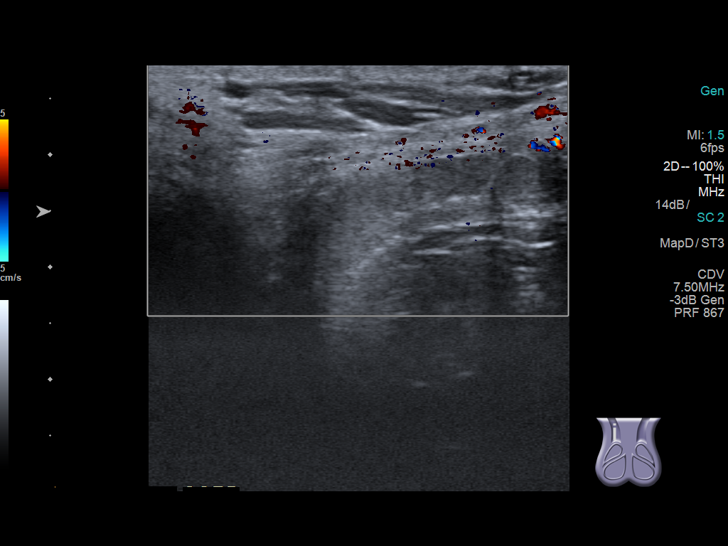

[14 of 25 positions shown; findings below may reference images not displayed]

FINDINGS: Right testicle

Measurements: 5 x 2.5 x 2.7 cm. No mass or microlithiasis
visualized.

Left testicle

Measurements: 4.7 x 2.2 x 3.1 cm. No mass or microlithiasis
visualized.

Right epididymis:  Normal in size and appearance.

Left epididymis:  Normal in size and appearance.

Hydrocele:  Small bilateral hydroceles.

Varicocele:  None visualized.

Pulsed Doppler interrogation of both testes demonstrates normal low
resistance arterial and venous waveforms bilaterally.
IMPRESSION: Small bilateral hydroceles, otherwise negative scrotal sonogram.

## 2019-03-24 ENCOUNTER — Other Ambulatory Visit: Payer: Self-pay | Admitting: Cardiovascular Disease

## 2019-04-12 ENCOUNTER — Telehealth: Payer: Self-pay | Admitting: *Deleted

## 2019-04-12 NOTE — Telephone Encounter (Signed)
   Primary Cardiologist:  Kate Sable, MD   Patient contacted.  History reviewed.  No symptoms to suggest any unstable cardiac conditions.  Based on discussion, with current pandemic situation, we will be postponing this appointment for Adrian Rivera with a plan for f/u in August 2020 or sooner if feasible/necessary.  If symptoms change, he has been instructed to contact our office.   Marlou Sa, RN  04/12/2019 10:27 AM         .

## 2019-04-17 ENCOUNTER — Ambulatory Visit: Payer: BLUE CROSS/BLUE SHIELD | Admitting: Cardiovascular Disease

## 2019-04-18 ENCOUNTER — Other Ambulatory Visit: Payer: Self-pay | Admitting: Cardiovascular Disease

## 2019-04-19 ENCOUNTER — Other Ambulatory Visit: Payer: Self-pay | Admitting: Cardiovascular Disease

## 2019-07-13 ENCOUNTER — Ambulatory Visit (INDEPENDENT_AMBULATORY_CARE_PROVIDER_SITE_OTHER): Payer: BC Managed Care – PPO | Admitting: Cardiovascular Disease

## 2019-07-13 ENCOUNTER — Encounter: Payer: Self-pay | Admitting: Cardiovascular Disease

## 2019-07-13 ENCOUNTER — Other Ambulatory Visit: Payer: Self-pay

## 2019-07-13 VITALS — BP 125/84 | HR 70 | Ht 70.0 in | Wt 165.6 lb

## 2019-07-13 DIAGNOSIS — I25118 Atherosclerotic heart disease of native coronary artery with other forms of angina pectoris: Secondary | ICD-10-CM | POA: Diagnosis not present

## 2019-07-13 DIAGNOSIS — I10 Essential (primary) hypertension: Secondary | ICD-10-CM | POA: Diagnosis not present

## 2019-07-13 DIAGNOSIS — E785 Hyperlipidemia, unspecified: Secondary | ICD-10-CM | POA: Diagnosis not present

## 2019-07-13 DIAGNOSIS — Z720 Tobacco use: Secondary | ICD-10-CM

## 2019-07-13 DIAGNOSIS — Z79899 Other long term (current) drug therapy: Secondary | ICD-10-CM | POA: Diagnosis not present

## 2019-07-13 NOTE — Progress Notes (Signed)
SUBJECTIVE: Adrian Rivera presents for a followup visit. He has a known history of coronary artery disease status post angioplasty and bare-metal stent placement to the LAD and RCA in 2000.   He then presented in February 2013 to [facility] with a non-ST elevation myocardial infarction. He underwent cardiac catheterization which showed an occluded RCA at the previously placed stent with left-to-right collaterals. The LAD had 50% proximal stenosis. The stent in the mid segment was patent with minimal restenosis. The left circumflex had an ulcerated 80% stenosis in the midsegment as well as 80% stenosis in OM 3. The patient underwent angioplasty and drug-eluting stent placement to both OM 3 and left circumflex. Ejection fraction was normal.   There have been issues with medication compliance in the past, as well as requesting Xanax.  The patient denies any symptoms of chest pain, palpitations, shortness of breath, lightheadedness, dizziness, leg swelling, orthopnea, PND, and syncope.  He has been camping with his family and really enjoys it.  He smokes about 1/2 pack of cigarettes daily.  He has not had any blood work this year.   Soc Hx:His wife has multiple sclerosis. They have a daughter, [name]. 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  Review of Systems: As per "subjective", otherwise negative.  Allergies  Allergen Reactions  . Cephalexin Hives and Rash    Current Outpatient Medications  Medication Sig Dispense Refill  . aspirin EC 81 MG tablet Take 81 mg by mouth daily.    Marland Kitchen atorvastatin (LIPITOR) 40 MG tablet TAKE 1 TABLET DAILY. 30 tablet 6  . clopidogrel (PLAVIX) 75 MG tablet TAKE 1 TABLET DAILY. 30 tablet 6  . lisinopril (ZESTRIL) 20 MG tablet TAKE 1 TABLET DAILY. 90 tablet 1  . metoprolol tartrate (LOPRESSOR) 25 MG tablet TAKE (1) TABLET TWICE DAILY. 60 tablet 6  . nitroGLYCERIN (NITROSTAT) 0.4 MG SL tablet Place 1 tablet (0.4 mg total) under  the tongue every 5 (five) minutes x 3 doses as needed for chest pain. 25 tablet 3   No current facility-administered medications for this visit.     Past Medical History:  Diagnosis Date  . Angina   . CAD (coronary artery disease)    cath 2000. Occluded LAD and significant RCA stenosis. BMS to LAD and RCA. NSTEMI in 12/2011: cath showed occluded RCA stent, 50% proximal LAD stenosis, patent stent, 80% mid LCX and 80% OM3. Had DES placement to both OM3 and LCX.   . Enlarged prostate    last 2 years  . Foley catheter in place 07/21/2017  . Hyperlipidemia   . Hypertension   . Myocardial infarction ([employer]) ago 44 and age 63  . Opioid dependence ([place])   . Rash    healing on both legs from keflex occured 1 week ago  . Tobacco use     Past Surgical History:  Procedure Laterality Date  . APPENDECTOMY    . CORONARY ANGIOPLASTY  10 yrs ago  . EYE SURGERY Bilateral 20 yrs ago   lasix both eyes   . HERNIA REPAIR    . LEFT HEART CATHETERIZATION WITH CORONARY ANGIOGRAM N/A 01/05/2012   Procedure: LEFT HEART CATHETERIZATION WITH CORONARY ANGIOGRAM;  Surgeon: Burnell Blanks, MD;  Location: [facility] CATH LAB;  Service: Cardiovascular;  Laterality: N/A;  . PERCUTANEOUS CORONARY STENT INTERVENTION (PCI-S)  01/05/2012   Procedure: PERCUTANEOUS CORONARY STENT INTERVENTION (PCI-S);  Surgeon: Burnell Blanks, MD;  Location: [facility] CATH LAB;  Service: Cardiovascular;;  . right  leg surgery with steel rod in place tibula and fibula  2010  . TRANSURETHRAL RESECTION OF PROSTATE N/A 08/05/2017   Procedure: BIPOLAR TRANSURETHRAL RESECTION OF THE PROSTATE (TURP);  Surgeon: Lucas Mallow, MD;  Location: [facility] ORS;  Service: Urology;  Laterality: N/A;    Social History   Socioeconomic History  . Marital status: Married    Spouse name: Not on file  . Number of children: Not on file  . Years of education: Not on file  . Highest education level: Not on file  Occupational History  . Not on file  Social  Needs  . Financial resource strain: Not on file  . Food insecurity    Worry: Not on file    Inability: Not on file  . Transportation needs    Medical: Not on file    Non-medical: Not on file  Tobacco Use  . Smoking status: Current Every Day Smoker    Packs/day: 1.00    Years: 35.00    Pack years: 35.00    Types: Cigarettes    Start date: 06/05/1976  . Smokeless tobacco: Never Used  Substance and Sexual Activity  . Alcohol use: No    Alcohol/week: 8.0 standard drinks    Types: 8 Cans of beer per week  . Drug use: Yes    Frequency: 6.0 times per week    Types: Hydromorphone, Opium    Comment: used xnax no prescription for few days ago, last used hydromorphone and opium 8 yrs ago  . Sexual activity: Yes  Lifestyle  . Physical activity    Days per week: Not on file    Minutes per session: Not on file  . Stress: Not on file  Relationships  . Social Herbalist on phone: Not on file    Gets together: Not on file    Attends religious service: Not on file    Active member of club or organization: Not on file    Attends meetings of clubs or organizations: Not on file    Relationship status: Not on file  . Intimate partner violence    Fear of current or ex partner: Not on file    Emotionally abused: Not on file    Physically abused: Not on file    Forced sexual activity: Not on file  Other Topics Concern  . Not on file  Social History Narrative  . Not on file     Vitals:   07/13/19 1328  BP: 125/84  Pulse: 70  SpO2: 97%  Weight: 165 lb 9.6 oz (75.1 kg)  Height: 5\' 10"  (1.778 m)    Wt Readings from Last 3 Encounters:  07/13/19 165 lb 9.6 oz (75.1 kg)  04/04/18 164 lb (74.4 kg)  08/05/17 164 lb (74.4 kg)     PHYSICAL EXAM General: NAD HEENT: Normal. Neck: No JVD, no thyromegaly. Lungs: Clear to auscultation bilaterally with normal respiratory effort. CV: Regular rate and rhythm, normal S1/S2, no S3/S4, no murmur. No pretibial or periankle edema.  No  carotid bruit.   Abdomen: Soft, nontender, no distention.  Neurologic: Alert and oriented.  Psych: Normal affect. Skin: Normal. Musculoskeletal: No gross deformities.    ECG: Sinus rhythm with no ischemic abnormalities   Labs: Lab Results  Component Value Date/Time   K 4.6 07/29/2017 09:11 AM   BUN 19 07/29/2017 09:11 AM   CREATININE 1.04 07/29/2017 09:11 AM   HGB 15.3 07/29/2017 09:11 AM     Lipids: Lab Results  Component Value Date/Time   LDLCALC 113 (H) 01/06/2012 05:00 AM   CHOL 195 01/06/2012 05:00 AM   TRIG 149 01/06/2012 05:00 AM   HDL 52 01/06/2012 05:00 AM       ASSESSMENT AND PLAN: 1. CAD: Symptomatically stable. Continue Lipitor, ASA, and Plavix (will continue DAPT indefinitely), lisinopril, and metoprolol.   I will check a comprehensive metabolic panel, CBC, and lipids.  2. Essential HTN: Controlled. No changes.  3. Hyperlipidemia: I will check lipids. Continue Lipitor.  4.  Tobacco abuse: He continues to smoke about 1/2 pack of cigarettes daily.  Cessation counseling was provided.    Disposition: Follow up 1 year   Kate Sable, M.D., F.A.C.C.

## 2019-07-13 NOTE — Patient Instructions (Addendum)
Medication Instructions:    Your physician recommends that you continue on your current medications as directed. Please refer to the Current Medication list given to you today.  Labwork: Your physician recommends that you return for a FASTING lipid profile, CMET and CBC as soon as possible. Please do not eat or drink for at least 8 hours when you have this done. You may go to [facility] or [employer] or [facility] in [place] to have this done.  Testing/Procedures:  NONE  Follow-Up:  Your physician recommends that you schedule a follow-up appointment in: 1 year. You will receive a reminder letter in the mail in about 10 months reminding you to call and schedule your appointment. If you don't receive this letter, please contact our office.  Any Other Special Instructions Will Be Listed Below (If Applicable).  If you need a refill on your cardiac medications before your next appointment, please call your pharmacy.

## 2019-09-25 ENCOUNTER — Other Ambulatory Visit: Payer: Self-pay | Admitting: Cardiovascular Disease

## 2019-10-28 DIAGNOSIS — R509 Fever, unspecified: Secondary | ICD-10-CM | POA: Diagnosis not present

## 2019-10-28 DIAGNOSIS — R109 Unspecified abdominal pain: Secondary | ICD-10-CM | POA: Diagnosis not present

## 2019-10-28 DIAGNOSIS — M549 Dorsalgia, unspecified: Secondary | ICD-10-CM | POA: Diagnosis not present

## 2019-10-28 DIAGNOSIS — Z87440 Personal history of urinary (tract) infections: Secondary | ICD-10-CM | POA: Diagnosis not present

## 2019-10-29 ENCOUNTER — Other Ambulatory Visit: Payer: Self-pay | Admitting: Cardiovascular Disease

## 2019-10-30 DIAGNOSIS — N390 Urinary tract infection, site not specified: Secondary | ICD-10-CM | POA: Diagnosis not present

## 2019-10-30 DIAGNOSIS — M545 Low back pain: Secondary | ICD-10-CM | POA: Diagnosis not present

## 2019-10-30 DIAGNOSIS — R109 Unspecified abdominal pain: Secondary | ICD-10-CM | POA: Diagnosis not present

## 2019-10-30 DIAGNOSIS — I10 Essential (primary) hypertension: Secondary | ICD-10-CM | POA: Diagnosis not present

## 2019-10-30 DIAGNOSIS — Z299 Encounter for prophylactic measures, unspecified: Secondary | ICD-10-CM | POA: Diagnosis not present

## 2019-10-30 DIAGNOSIS — Z6822 Body mass index (BMI) 22.0-22.9, adult: Secondary | ICD-10-CM | POA: Diagnosis not present

## 2019-12-27 DIAGNOSIS — Z299 Encounter for prophylactic measures, unspecified: Secondary | ICD-10-CM | POA: Diagnosis not present

## 2019-12-27 DIAGNOSIS — D485 Neoplasm of uncertain behavior of skin: Secondary | ICD-10-CM | POA: Diagnosis not present

## 2019-12-27 DIAGNOSIS — Z6824 Body mass index (BMI) 24.0-24.9, adult: Secondary | ICD-10-CM | POA: Diagnosis not present

## 2019-12-27 DIAGNOSIS — I10 Essential (primary) hypertension: Secondary | ICD-10-CM | POA: Diagnosis not present

## 2019-12-27 DIAGNOSIS — L570 Actinic keratosis: Secondary | ICD-10-CM | POA: Diagnosis not present

## 2020-01-18 DIAGNOSIS — Z299 Encounter for prophylactic measures, unspecified: Secondary | ICD-10-CM | POA: Diagnosis not present

## 2020-01-18 DIAGNOSIS — L570 Actinic keratosis: Secondary | ICD-10-CM | POA: Diagnosis not present

## 2020-01-18 DIAGNOSIS — Z6824 Body mass index (BMI) 24.0-24.9, adult: Secondary | ICD-10-CM | POA: Diagnosis not present

## 2020-01-18 DIAGNOSIS — I10 Essential (primary) hypertension: Secondary | ICD-10-CM | POA: Diagnosis not present

## 2020-02-21 ENCOUNTER — Ambulatory Visit: Payer: BC Managed Care – PPO

## 2020-04-03 DIAGNOSIS — Z23 Encounter for immunization: Secondary | ICD-10-CM | POA: Diagnosis not present

## 2020-04-10 ENCOUNTER — Other Ambulatory Visit: Payer: Self-pay | Admitting: Cardiovascular Disease

## 2020-05-06 DIAGNOSIS — Z23 Encounter for immunization: Secondary | ICD-10-CM | POA: Diagnosis not present

## 2020-05-30 ENCOUNTER — Encounter: Payer: Self-pay | Admitting: *Deleted

## 2020-07-09 ENCOUNTER — Other Ambulatory Visit: Payer: Self-pay | Admitting: *Deleted

## 2020-07-09 MED ORDER — METOPROLOL TARTRATE 25 MG PO TABS
ORAL_TABLET | ORAL | 0 refills | Status: DC
Start: 2020-07-09 — End: 2020-08-11

## 2020-07-09 MED ORDER — ATORVASTATIN CALCIUM 40 MG PO TABS: 40.0000 mg | ORAL_TABLET | Freq: Every day | ORAL | 0 refills | Status: DC

## 2020-07-09 MED ORDER — CLOPIDOGREL BISULFATE 75 MG PO TABS: 75.0000 mg | ORAL_TABLET | Freq: Every day | ORAL | 0 refills | Status: DC

## 2020-07-21 ENCOUNTER — Encounter: Payer: Self-pay | Admitting: Family Medicine

## 2020-07-21 ENCOUNTER — Ambulatory Visit (INDEPENDENT_AMBULATORY_CARE_PROVIDER_SITE_OTHER): Payer: BC Managed Care – PPO | Admitting: Family Medicine

## 2020-07-21 VITALS — BP 116/72 | HR 69 | Ht 70.0 in | Wt 175.4 lb

## 2020-07-21 DIAGNOSIS — I10 Essential (primary) hypertension: Secondary | ICD-10-CM

## 2020-07-21 DIAGNOSIS — I2510 Atherosclerotic heart disease of native coronary artery without angina pectoris: Secondary | ICD-10-CM

## 2020-07-21 DIAGNOSIS — E782 Mixed hyperlipidemia: Secondary | ICD-10-CM | POA: Diagnosis not present

## 2020-07-21 DIAGNOSIS — Z720 Tobacco use: Secondary | ICD-10-CM

## 2020-07-21 NOTE — Patient Instructions (Signed)

## 2020-07-21 NOTE — Progress Notes (Signed)
Cardiology Office Note  Date: 07/21/2020   ID: [name], DOB 11-29-1960, MRN [id number]  PCP:  Glenda Chroman, MD  Cardiologist:  No primary care provider on file. Electrophysiologist:  None   Chief Complaint: Follow-up CAD  History of Present Illness: Adrian Rivera is a 59 y.o. male with a history of CAD, HLD, HTN, tobacco abuse.  Status post angioplasty and BMS to LAD and RCA 2000. February 2013 [facility] NSTEMI, occluded RCA had previously placed stent with left to right collaterals.  LAD had 50% proximal stenosis.  The stent in the midsegment was patent with minimal restenosis.  LCx had ulcerated 80% stenosis in the midsegment as well as 80% stenosis in OM 3.  Angioplasty and DES placement to both OM 3 and LCx.  Smoking about 1/2 pack/cigarettes/day  Last visit with Dr. Bronson Ing 07/13/2019.  He denied any symptoms of chest pain, palpitations, dizziness, lightheadedness, edema, orthopnea, PND, syncope.  He was continuing Lipitor, aspirin, Plavix (continuing DAPT indefinitely), lisinopril, and metoprolol.  Blood pressure was controlled on current therapy.  He was continuing Lipitor for hyperlipidemia.  He was counseled on smoking cessation.  Patient is here for 1 year follow-up.  He denies any recent acute illnesses or hospitalizations.  States he has received both Covid injections of the Moderna vaccine.  He is asking questions about possibly getting the booster shot when offered.  He denies any anginal or exertional symptoms.  He continues to smoke.  He states he is going to try to stop.  He states he quit 3 years at one point but started back.  He denies any palpitations or arrhythmias, orthostatic symptoms, CVA or TIA-like symptoms, bleeding issues, PND, orthopnea, lower extremity edema, claudication-like symptoms, DVT or PE-like symptoms.  EKG today shows sinus rhythm rate of 63, right bundle branch block.   Past Medical History:  Diagnosis Date  .  Angina   . CAD (coronary artery disease)    cath 2000. Occluded LAD and significant RCA stenosis. BMS to LAD and RCA. NSTEMI in 12/2011: cath showed occluded RCA stent, 50% proximal LAD stenosis, patent stent, 80% mid LCX and 80% OM3. Had DES placement to both OM3 and LCX.   . Enlarged prostate    last 2 years  . Foley catheter in place 07/21/2017  . Hyperlipidemia   . Hypertension   . Myocardial infarction ([place]) ago 4 and age 54  . Opioid dependence ([place])   . Rash    healing on both legs from keflex occured 1 week ago  . Tobacco use     Past Surgical History:  Procedure Laterality Date  . APPENDECTOMY    . CORONARY ANGIOPLASTY  10 yrs ago  . EYE SURGERY Bilateral 20 yrs ago   lasix both eyes   . HERNIA REPAIR    . LEFT HEART CATHETERIZATION WITH CORONARY ANGIOGRAM N/A 01/05/2012   Procedure: LEFT HEART CATHETERIZATION WITH CORONARY ANGIOGRAM;  Surgeon: Burnell Blanks, MD;  Location: [facility] CATH LAB;  Service: Cardiovascular;  Laterality: N/A;  . PERCUTANEOUS CORONARY STENT INTERVENTION (PCI-S)  01/05/2012   Procedure: PERCUTANEOUS CORONARY STENT INTERVENTION (PCI-S);  Surgeon: Burnell Blanks, MD;  Location: [facility] CATH LAB;  Service: Cardiovascular;;  . right leg surgery with steel rod in place tibula and fibula  2010  . TRANSURETHRAL RESECTION OF PROSTATE N/A 08/05/2017   Procedure: BIPOLAR TRANSURETHRAL RESECTION OF THE PROSTATE (TURP);  Surgeon: Lucas Mallow, MD;  Location: [facility] ORS;  Service: Urology;  Laterality: N/A;    Current Outpatient Medications  Medication Sig Dispense Refill  . aspirin EC 81 MG tablet Take 81 mg by mouth daily.    Marland Kitchen atorvastatin (LIPITOR) 40 MG tablet Take 1 tablet (40 mg total) by mouth daily. 30 tablet 0  . clopidogrel (PLAVIX) 75 MG tablet Take 1 tablet (75 mg total) by mouth daily. 30 tablet 0  . lisinopril (ZESTRIL) 20 MG tablet TAKE 1 TABLET BY MOUTH ONCE DAILY. 30 tablet 6  . metoprolol tartrate (LOPRESSOR) 25 MG tablet TAKE (1)  TABLET TWICE DAILY. 60 tablet 0  . nitroGLYCERIN (NITROSTAT) 0.4 MG SL tablet Place 1 tablet (0.4 mg total) under the tongue every 5 (five) minutes x 3 doses as needed for chest pain. 25 tablet 3   No current facility-administered medications for this visit.   Allergies:  Cephalexin   Social History: The patient  reports that he has been smoking cigarettes. He started smoking about 44 years ago. He has a 35.00 pack-year smoking history. He has never used smokeless tobacco. He reports current drug use. Frequency: 6.00 times per week. Drugs: Hydromorphone and Opium. He reports that he does not drink alcohol.   Family History: The patient's family history includes Cancer in his mother; Coronary artery disease in his father; Diabetes type II in his father; Heart attack in his father; Heart disease in his father; Hyperlipidemia in his father; Hypertension in his father.   ROS:  Please see the history of present illness. Otherwise, complete review of systems is positive for none.  All other systems are reviewed and negative.   Physical Exam: VS:  BP 116/72   Pulse 69   Ht 5\' 10"  (1.778 m)   Wt 175 lb 6.4 oz (79.6 kg)   SpO2 96%   BMI 25.17 kg/m , BMI Body mass index is 25.17 kg/m.  Wt Readings from Last 3 Encounters:  07/21/20 175 lb 6.4 oz (79.6 kg)  07/13/19 165 lb 9.6 oz (75.1 kg)  04/04/18 164 lb (74.4 kg)    General: Patient appears comfortable at rest. Neck: Supple, no elevated JVP or carotid bruits, no thyromegaly. Lungs: Clear to auscultation, nonlabored breathing at rest. Cardiac: Regular rate and rhythm, no S3 or significant systolic murmur, no pericardial rub. Extremities: No pitting edema, distal pulses 2+. Skin: Warm and dry. Musculoskeletal: No kyphosis. Neuropsychiatric: Alert and oriented x3, affect grossly appropriate.  ECG:  An ECG dated 07/21/2020 was personally reviewed today and demonstrated:  Sinus rhythm with fusion complexes right bundle branch block rate of  63  Recent Labwork: No results found for requested labs within last 8760 hours.     Component Value Date/Time   CHOL 195 01/06/2012 0500   TRIG 149 01/06/2012 0500   HDL 52 01/06/2012 0500   CHOLHDL 3.8 01/06/2012 0500   VLDL 30 01/06/2012 0500   LDLCALC 113 (H) 01/06/2012 0500    Other Studies Reviewed Today:   Assessment and Plan:  1. CAD in native artery   2. Essential hypertension   3. Mixed hyperlipidemia   4. Tobacco abuse    1. CAD in native artery Denies any progressive anginal symptoms.  No chest pain, pressure, tightness, radiation, or associated nausea, vomiting, or diaphoresis during exertional activity.  No use of nitroglycerin.  Continue aspirin 81 mg daily.  Plavix 75 mg daily.  Nitroglycerin sublingual 0.4 mg as needed.  Continue metoprolol 25 mg p.o. twice daily.  2. Essential hypertension Blood pressure is well controlled on  current therapy.  Continue lisinopril 20 mg p.o. daily.  Continue metoprolol 25 mg p.o. twice daily.  3. Mixed hyperlipidemia Continue atorvastatin 40 mg daily.  No recent lipid profiles in our system.  We will attempt to obtain from PCP any recent lipid lab work.  4. Tobacco abuse Continues to smoke about a half a pack per day.  Attempted to reinforce cessation.  Patient states he knows he needs to quit and will attempt to stop.  Medication Adjustments/Labs and Tests Ordered: Current medicines are reviewed at length with the patient today.  Concerns regarding medicines are outlined above.   Disposition: Follow-up with   Dr. Harl Bowie or APP 1 year  Signed, Himmat Enberg, NP 07/21/2020 4:08 PM    [facility] at [place], [place], [place] [address] Phone: [phone]; Fax: [phone]

## 2020-08-11 ENCOUNTER — Other Ambulatory Visit: Payer: Self-pay | Admitting: *Deleted

## 2020-08-11 MED ORDER — ATORVASTATIN CALCIUM 40 MG PO TABS
40.0000 mg | ORAL_TABLET | Freq: Every day | ORAL | 6 refills | Status: DC
Start: 2020-08-11 — End: 2020-09-15

## 2020-08-11 MED ORDER — CLOPIDOGREL BISULFATE 75 MG PO TABS
75.0000 mg | ORAL_TABLET | Freq: Every day | ORAL | 6 refills | Status: DC
Start: 2020-08-11 — End: 2020-09-15

## 2020-08-11 MED ORDER — METOPROLOL TARTRATE 25 MG PO TABS: ORAL_TABLET | ORAL | 6 refills | Status: DC

## 2020-09-15 ENCOUNTER — Other Ambulatory Visit: Payer: Self-pay | Admitting: Family Medicine

## 2021-01-15 ENCOUNTER — Other Ambulatory Visit: Payer: Self-pay | Admitting: *Deleted

## 2021-01-15 MED ORDER — CLOPIDOGREL BISULFATE 75 MG PO TABS
75.0000 mg | ORAL_TABLET | Freq: Every day | ORAL | 2 refills | Status: DC
Start: 2021-01-15 — End: 2021-05-21

## 2021-01-15 MED ORDER — METOPROLOL TARTRATE 25 MG PO TABS
25.0000 mg | ORAL_TABLET | Freq: Two times a day (BID) | ORAL | 2 refills | Status: DC
Start: 2021-01-15 — End: 2021-08-14

## 2021-01-15 MED ORDER — ATORVASTATIN CALCIUM 40 MG PO TABS
40.0000 mg | ORAL_TABLET | Freq: Every day | ORAL | 2 refills | Status: DC
Start: 2021-01-15 — End: 2021-05-21

## 2021-04-21 ENCOUNTER — Other Ambulatory Visit: Payer: Self-pay | Admitting: Family Medicine

## 2021-05-20 ENCOUNTER — Other Ambulatory Visit: Payer: Self-pay | Admitting: Family Medicine

## 2021-07-21 ENCOUNTER — Other Ambulatory Visit: Payer: Self-pay | Admitting: Family Medicine

## 2021-08-03 ENCOUNTER — Other Ambulatory Visit: Payer: Self-pay | Admitting: Family Medicine

## 2021-08-04 ENCOUNTER — Telehealth: Payer: Self-pay | Admitting: Cardiology

## 2021-08-04 ENCOUNTER — Other Ambulatory Visit: Payer: Self-pay | Admitting: Family Medicine

## 2021-08-04 MED ORDER — LISINOPRIL 20 MG PO TABS: 20.0000 mg | ORAL_TABLET | Freq: Every day | ORAL | 2 refills | Status: DC

## 2021-08-04 NOTE — Telephone Encounter (Signed)
New message      *STAT* If patient is at the pharmacy, call can be transferred to refill team.   1. Which medications need to be refilled? (please list name of each medication and dose if known) lisinopril (ZESTRIL) 20 MG tablet  2. Which pharmacy/location (including street and city if local pharmacy) is medication to be sent to? [facility]   3. Do they need a 30 day or 90 day supply? Has appt 08/14/21

## 2021-08-13 NOTE — Progress Notes (Addendum)
Cardiology Office Note  Date: 08/14/2021   ID: Adrian Rivera, DOB [birth year], MRN [id number]  PCP:  Glenda Chroman, MD  Cardiologist:  None Electrophysiologist:  None   Chief Complaint: 1 year follow up  History of Present Illness: Adrian Rivera is a 60 y.o. male with a history of CAD, HLD, HTN, tobacco abuse.  Status post angioplasty and BMS to LAD and RCA 2000. February 2013 [facility] NSTEMI, occluded RCA had previously placed stent with left to right collaterals.  LAD had 50% proximal stenosis.  The stent in the midsegment was patent with minimal restenosis.  LCx had ulcerated 80% stenosis in the midsegment as well as 80% stenosis in OM 3.  Angioplasty and DES placement to both OM 3 and LCx.  He is here today for 1 year follow-up.  He denies any current anginal symptoms or nitroglycerin use.  Denies any DOE or SOB, PND, orthopnea, palpitations or arrhythmias, orthostatic symptoms, CVA or TIA-like symptoms, bleeding issues.  Denies any claudication-like symptoms, DVT or PE-like symptoms, or lower extremity edema.  He states has been a while since he seen his PCP.  He states he continues to smoke.  Current cardiac regimen includes; aspirin 81 mg daily, atorvastatin 40 mg daily, Plavix 75 mg daily, lisinopril 20 mg daily, metoprolol 25 mg p.o. twice daily and as needed sublingual nitroglycerin.  EKG today shows sinus bradycardia rate of 53 with RBBB.   Past Medical History:  Diagnosis Date   Angina    CAD (coronary artery disease)    cath 2000. Occluded LAD and significant RCA stenosis. BMS to LAD and RCA. NSTEMI in 12/2011: cath showed occluded RCA stent, 50% proximal LAD stenosis, patent stent, 80% mid LCX and 80% OM3. Had DES placement to both OM3 and LCX.    Enlarged prostate    last 2 years   Foley catheter in place 07/21/2017   Hyperlipidemia    Hypertension    Myocardial infarction [facility] ago 54 and age 77   Opioid dependence ([place])    Rash    healing  on both legs from keflex occured 1 week ago   Tobacco use     Past Surgical History:  Procedure Laterality Date   APPENDECTOMY     CORONARY ANGIOPLASTY  10 yrs ago   EYE SURGERY Bilateral 20 yrs ago   lasix both eyes    HERNIA REPAIR     LEFT HEART CATHETERIZATION WITH CORONARY ANGIOGRAM N/A 01/05/2012   Procedure: LEFT HEART CATHETERIZATION WITH CORONARY ANGIOGRAM;  Surgeon: Burnell Blanks, MD;  Location: [facility] CATH LAB;  Service: Cardiovascular;  Laterality: N/A;   PERCUTANEOUS CORONARY STENT INTERVENTION (PCI-S)  01/05/2012   Procedure: PERCUTANEOUS CORONARY STENT INTERVENTION (PCI-S);  Surgeon: Burnell Blanks, MD;  Location: [facility] CATH LAB;  Service: Cardiovascular;;   right leg surgery with steel rod in place tibula and fibula  2010   TRANSURETHRAL RESECTION OF PROSTATE N/A 08/05/2017   Procedure: BIPOLAR TRANSURETHRAL RESECTION OF THE PROSTATE (TURP);  Surgeon: Lucas Mallow, MD;  Location: [facility] ORS;  Service: Urology;  Laterality: N/A;    Current Outpatient Medications  Medication Sig Dispense Refill   aspirin EC 81 MG tablet Take 81 mg by mouth daily.     atorvastatin (LIPITOR) 40 MG tablet Take 1 tablet (40 mg total) by mouth daily. 90 tablet 3   clopidogrel (PLAVIX) 75 MG tablet Take 1 tablet (75 mg total) by mouth daily. 90 tablet 3  lisinopril (ZESTRIL) 20 MG tablet Take 1 tablet (20 mg total) by mouth daily. 90 tablet 3   metoprolol tartrate (LOPRESSOR) 25 MG tablet Take 1 tablet (25 mg total) by mouth 2 (two) times daily. 180 tablet 3   nitroGLYCERIN (NITROSTAT) 0.4 MG SL tablet Place 1 tablet (0.4 mg total) under the tongue every 5 (five) minutes x 3 doses as needed for chest pain. 25 tablet 3   No current facility-administered medications for this visit.   Allergies:  Cephalexin   Social History: The patient  reports that he has been smoking cigarettes. He started smoking about 45 years ago. He has a 35.00 pack-year smoking history. He has never used  smokeless tobacco. He reports current drug use. Frequency: 6.00 times per week. Drugs: Hydromorphone and Opium. He reports that he does not drink alcohol.   Family History: The patient's family history includes Cancer in his mother; Coronary artery disease in his father; Diabetes type II in his father; Heart attack in his father; Heart disease in his father; Hyperlipidemia in his father; Hypertension in his father.   ROS:  Please see the history of present illness. Otherwise, complete review of systems is positive for none.  All other systems are reviewed and negative.   Physical Exam: VS:  BP 128/84   Pulse 62   Ht 5\' 10"  (1.778 m)   Wt 177 lb (80.3 kg)   SpO2 96%   BMI 25.40 kg/m , BMI Body mass index is 25.4 kg/m.  Wt Readings from Last 3 Encounters:  08/14/21 177 lb (80.3 kg)  07/21/20 175 lb 6.4 oz (79.6 kg)  07/13/19 165 lb 9.6 oz (75.1 kg)    General: Patient appears comfortable at rest. Neck: Supple, no elevated JVP or carotid bruits, no thyromegaly. Lungs: Clear to auscultation, nonlabored breathing at rest. Cardiac: Regular rate and rhythm, no S3 or significant systolic murmur, no pericardial rub. Extremities: No pitting edema, distal pulses 2+. Skin: Warm and dry. Musculoskeletal: No kyphosis. Neuropsychiatric: Alert and oriented x3, affect grossly appropriate.  ECG: 08/14/2021 EKG sinus bradycardia rate of 53 RBBB  Recent Labwork: No results found for requested labs within last 8760 hours.     Component Value Date/Time   CHOL 195 01/06/2012 0500   TRIG 149 01/06/2012 0500   HDL 52 01/06/2012 0500   CHOLHDL 3.8 01/06/2012 0500   VLDL 30 01/06/2012 0500   LDLCALC 113 (H) 01/06/2012 0500    Other Studies Reviewed Today:   Assessment and Plan:  1. CAD in native artery   2. Essential hypertension   3. Mixed hyperlipidemia   4. Tobacco abuse     1. CAD in native artery Denies any progressive anginal symptoms.  No nitroglycerin use.  Continue aspirin 81 mg  daily.  Plavix 75 mg daily.  Nitroglycerin sublingual 0.4 mg as needed.  Continue metoprolol 25 mg p.o. twice daily.  2. Essential hypertension Blood pressure is well controlled on current therapy.  BP today 128/84.  Continue lisinopril 20 mg p.o. daily.  Continue metoprolol 25 mg p.o. twice daily.  3. Mixed hyperlipidemia Continue atorvastatin 40 mg daily.  States been a while since he seen his primary care provider.  Encouraged follow-up plan to have follow-up lab work.  4. Tobacco abuse Continues to smoke about a half a pack per day.  Previously reinforced cessation.  He continues to smoke today.  Advised cessation   Medication Adjustments/Labs and Tests Ordered: Current medicines are reviewed at length with the patient today.  Concerns regarding medicines are outlined above.   Disposition: Follow-up with  Dr. Harl Bowie or APP 1 year  Signed, Alexandria Current, NP 08/14/2021 4:10 PM    [facility] at [place], [place], [place] [address] Phone: [phone]; Fax: [phone]

## 2021-08-14 ENCOUNTER — Ambulatory Visit (INDEPENDENT_AMBULATORY_CARE_PROVIDER_SITE_OTHER): Payer: BC Managed Care – PPO | Admitting: Family Medicine

## 2021-08-14 ENCOUNTER — Encounter: Payer: Self-pay | Admitting: Family Medicine

## 2021-08-14 VITALS — BP 128/84 | HR 62 | Ht 70.0 in | Wt 177.0 lb

## 2021-08-14 DIAGNOSIS — I2510 Atherosclerotic heart disease of native coronary artery without angina pectoris: Secondary | ICD-10-CM | POA: Diagnosis not present

## 2021-08-14 DIAGNOSIS — E782 Mixed hyperlipidemia: Secondary | ICD-10-CM | POA: Diagnosis not present

## 2021-08-14 DIAGNOSIS — Z720 Tobacco use: Secondary | ICD-10-CM

## 2021-08-14 DIAGNOSIS — I10 Essential (primary) hypertension: Secondary | ICD-10-CM | POA: Diagnosis not present

## 2021-08-14 MED ORDER — LISINOPRIL 20 MG PO TABS: 20.0000 mg | ORAL_TABLET | Freq: Every day | ORAL | 3 refills | Status: DC

## 2021-08-14 MED ORDER — NITROGLYCERIN 0.4 MG SL SUBL: 0.4000 mg | SUBLINGUAL_TABLET | SUBLINGUAL | 3 refills | Status: DC | PRN

## 2021-08-14 MED ORDER — METOPROLOL TARTRATE 25 MG PO TABS: 25.0000 mg | ORAL_TABLET | Freq: Two times a day (BID) | ORAL | 3 refills | Status: DC

## 2021-08-14 MED ORDER — CLOPIDOGREL BISULFATE 75 MG PO TABS: 75.0000 mg | ORAL_TABLET | Freq: Every day | ORAL | 3 refills | Status: DC

## 2021-08-14 MED ORDER — ATORVASTATIN CALCIUM 40 MG PO TABS: 40.0000 mg | ORAL_TABLET | Freq: Every day | ORAL | 3 refills | Status: DC

## 2021-08-14 NOTE — Patient Instructions (Signed)
Medication Instructions:  Your physician recommends that you continue on your current medications as directed. Please refer to the Current Medication list given to you today.  *If you need a refill on your cardiac medications before your next appointment, please call your pharmacy*   Lab Work: NONE  If you have labs (blood work) drawn today and your tests are completely normal, you will receive your results only by: [place] (if you have MyChart) OR A paper copy in the mail If you have any lab test that is abnormal or we need to change your treatment, we will call you to review the results.   Testing/Procedures: NONE    Follow-Up: At [facility], you and your health needs are our priority.  As part of our continuing mission to provide you with exceptional heart care, we have created designated Provider Care Teams.  These Care Teams include your primary Cardiologist (physician) and Advanced Practice Providers (APPs -  Physician Assistants and Nurse Practitioners) who all work together to provide you with the care you need, when you need it.  We recommend signing up for the patient portal called "MyChart".  Sign up information is provided on this After Visit Summary.  MyChart is used to connect with patients for Virtual Visits (Telemedicine).  Patients are able to view lab/test results, encounter notes, upcoming appointments, etc.  Non-urgent messages can be sent to your provider as well.   To learn more about what you can do with MyChart, go to [email or web address].    Your next appointment:   1 year(s)  The format for your next appointment:   In Person  Provider:   Katina Dung, NP   Other Instructions Thank you for choosing [facility]!

## 2021-08-14 NOTE — Addendum Note (Signed)
Addended by: Levonne Hubert on: 08/14/2021 05:02 PM   Modules accepted: Orders

## 2021-09-24 ENCOUNTER — Telehealth: Payer: Self-pay | Admitting: Family Medicine

## 2021-09-24 MED ORDER — LISINOPRIL 20 MG PO TABS: 20.0000 mg | ORAL_TABLET | Freq: Every day | ORAL | 0 refills | Status: DC

## 2021-09-24 NOTE — Telephone Encounter (Signed)
*  STAT* If patient is at the pharmacy, call can be transferred to refill team.   1. Which medications need to be refilled? (please list name of each medication and dose if known) lisinopril (ZESTRIL) 20 MG tablet  2. Which pharmacy/location (including street and city if local pharmacy) is medication to be sent to? [place], [place]  3. Do they need a 30 day or 90 day supply? 17 day supply

## 2021-09-24 NOTE — Telephone Encounter (Signed)
Done.  Trying to get all his medications refilling on same date.

## 2021-10-20 ENCOUNTER — Other Ambulatory Visit: Payer: Self-pay | Admitting: Cardiology

## 2021-10-20 ENCOUNTER — Other Ambulatory Visit: Payer: Self-pay | Admitting: Family Medicine

## 2021-10-21 ENCOUNTER — Encounter: Payer: Self-pay | Admitting: *Deleted

## 2021-11-11 ENCOUNTER — Other Ambulatory Visit: Payer: Self-pay | Admitting: Cardiology

## 2022-01-21 ENCOUNTER — Other Ambulatory Visit: Payer: Self-pay | Admitting: *Deleted

## 2022-01-21 MED ORDER — CLOPIDOGREL BISULFATE 75 MG PO TABS: 75.0000 mg | ORAL_TABLET | Freq: Every day | ORAL | 3 refills | Status: DC

## 2022-02-24 ENCOUNTER — Other Ambulatory Visit: Payer: Self-pay | Admitting: Cardiology

## 2022-03-26 ENCOUNTER — Other Ambulatory Visit: Payer: Self-pay | Admitting: *Deleted

## 2022-03-26 MED ORDER — METOPROLOL TARTRATE 25 MG PO TABS: ORAL_TABLET | ORAL | 1 refills | Status: DC

## 2022-03-26 MED ORDER — ATORVASTATIN CALCIUM 40 MG PO TABS: 40.0000 mg | ORAL_TABLET | Freq: Every day | ORAL | 1 refills | Status: DC

## 2022-08-27 ENCOUNTER — Other Ambulatory Visit: Payer: Self-pay | Admitting: *Deleted

## 2022-08-27 MED ORDER — CLOPIDOGREL BISULFATE 75 MG PO TABS: 75.0000 mg | ORAL_TABLET | Freq: Every day | ORAL | 0 refills | Status: DC

## 2022-08-30 ENCOUNTER — Telehealth: Payer: Self-pay | Admitting: Internal Medicine

## 2022-08-30 NOTE — Telephone Encounter (Signed)
*  STAT* If patient is at the pharmacy, call can be transferred to refill team.   1. Which medications need to be refilled? (please list name of each medication and dose if known) clopidogrel (PLAVIX) 75 MG tablet metoprolol tartrate (LOPRESSOR) 25 MG tablet atorvastatin (LIPITOR) 40 MG tablet lisinopril (ZESTRIL) 20 MG tablet  2. Which pharmacy/location (including street and city if local pharmacy) is medication to be sent to? [place], [place] [facility]  3. Do they need a 30 day or 90 day supply? 90   Patient is completely out of this medication  Patient has appt scheduled with Dr. Dellia Cloud on 11/01

## 2022-08-31 ENCOUNTER — Other Ambulatory Visit: Payer: Self-pay | Admitting: Student

## 2022-08-31 MED ORDER — ATORVASTATIN CALCIUM 40 MG PO TABS: 40.0000 mg | ORAL_TABLET | Freq: Every day | ORAL | 0 refills | Status: DC

## 2022-08-31 MED ORDER — LISINOPRIL 20 MG PO TABS: 20.0000 mg | ORAL_TABLET | Freq: Every day | ORAL | 0 refills | Status: DC

## 2022-08-31 MED ORDER — METOPROLOL TARTRATE 25 MG PO TABS: ORAL_TABLET | ORAL | 0 refills | Status: DC

## 2022-08-31 NOTE — Telephone Encounter (Signed)
Per Adrian Rivera, refills given until upcoming visit on 11/1 with Dr. Dellia Cloud.

## 2022-09-22 ENCOUNTER — Ambulatory Visit: Payer: BC Managed Care – PPO | Admitting: Internal Medicine

## 2022-09-23 ENCOUNTER — Ambulatory Visit: Payer: BC Managed Care – PPO | Attending: Internal Medicine | Admitting: Internal Medicine

## 2022-09-23 ENCOUNTER — Encounter: Payer: Self-pay | Admitting: Internal Medicine

## 2022-09-23 VITALS — BP 124/86 | HR 95 | Ht 70.0 in | Wt 177.0 lb

## 2022-09-23 DIAGNOSIS — I10 Essential (primary) hypertension: Secondary | ICD-10-CM | POA: Diagnosis not present

## 2022-09-23 DIAGNOSIS — Z79899 Other long term (current) drug therapy: Secondary | ICD-10-CM | POA: Diagnosis not present

## 2022-09-23 DIAGNOSIS — E782 Mixed hyperlipidemia: Secondary | ICD-10-CM | POA: Diagnosis not present

## 2022-09-23 MED ORDER — LISINOPRIL 20 MG PO TABS: 20.0000 mg | ORAL_TABLET | Freq: Every day | ORAL | 2 refills | Status: DC

## 2022-09-23 MED ORDER — METOPROLOL TARTRATE 25 MG PO TABS: ORAL_TABLET | ORAL | 2 refills | Status: DC

## 2022-09-23 MED ORDER — CLOPIDOGREL BISULFATE 75 MG PO TABS: 75.0000 mg | ORAL_TABLET | Freq: Every day | ORAL | 2 refills | Status: DC

## 2022-09-23 MED ORDER — ATORVASTATIN CALCIUM 40 MG PO TABS: 40.0000 mg | ORAL_TABLET | Freq: Every day | ORAL | 1 refills | Status: DC

## 2022-09-23 NOTE — Progress Notes (Signed)
Cardiology Office Note  Date: 09/23/2022   ID: Adrian Rivera, DOB 06-May-1961, MRN [id number]  PCP:  Glenda Chroman, MD  Cardiologist:  Chalmers Guest, MD Electrophysiologist:  None   Reason for Office Visit: Follow-up of CAD   History of Present Illness: Adrian Rivera is a 61 y.o. male known to have CAD s/p BMS to LAD and RCA in 2000, NSTEMI in 2013 s/p OM 3 and LCx PCI with residual RCA ISR with left-to-right collaterals and mid LAD 50% stenosis with normal LVEF, HTN, HLD, nicotine abuse presented to cardiology clinic for follow-up visit.  No interval ER visits or hospitalizations. Patient denied any rest or exertional chest discomfort, tightness, heaviness or pressure, rest or exertional dyspnea (can walk for one mile at their own pace before getting SOB), palpitations, light-headedness, syncope and LE swelling. Compliant with medications and no side-effects. No bleeding complications. He continues to smoke cigarettes, 1 pack/day. He stated he is trying to quit smoking and he is very hard for him. Denied alcohol use or illicit drug abuse.   Past Medical History:  Diagnosis Date   Angina    CAD (coronary artery disease)    cath 2000. Occluded LAD and significant RCA stenosis. BMS to LAD and RCA. NSTEMI in 12/2011: cath showed occluded RCA stent, 50% proximal LAD stenosis, patent stent, 80% mid LCX and 80% OM3. Had DES placement to both OM3 and LCX.    Enlarged prostate    last 2 years   Foley catheter in place 07/21/2017   Hyperlipidemia    Hypertension    Myocardial infarction [facility]) ago 68 and age 45   Opioid dependence ([place])    Rash    healing on both legs from keflex occured 1 week ago   Tobacco use     Past Surgical History:  Procedure Laterality Date   APPENDECTOMY     CORONARY ANGIOPLASTY  10 yrs ago   EYE SURGERY Bilateral 20 yrs ago   lasix both eyes    HERNIA REPAIR     LEFT HEART CATHETERIZATION WITH CORONARY ANGIOGRAM N/A 01/05/2012    Procedure: LEFT HEART CATHETERIZATION WITH CORONARY ANGIOGRAM;  Surgeon: Burnell Blanks, MD;  Location: [facility] CATH LAB;  Service: Cardiovascular;  Laterality: N/A;   PERCUTANEOUS CORONARY STENT INTERVENTION (PCI-S)  01/05/2012   Procedure: PERCUTANEOUS CORONARY STENT INTERVENTION (PCI-S);  Surgeon: Burnell Blanks, MD;  Location: [facility] CATH LAB;  Service: Cardiovascular;;   right leg surgery with steel rod in place tibula and fibula  2010   TRANSURETHRAL RESECTION OF PROSTATE N/A 08/05/2017   Procedure: BIPOLAR TRANSURETHRAL RESECTION OF THE PROSTATE (TURP);  Surgeon: Lucas Mallow, MD;  Location: [facility] ORS;  Service: Urology;  Laterality: N/A;    Current Outpatient Medications  Medication Sig Dispense Refill   aspirin EC 81 MG tablet Take 81 mg by mouth daily.     nitroGLYCERIN (NITROSTAT) 0.4 MG SL tablet Place 1 tablet (0.4 mg total) under the tongue every 5 (five) minutes x 3 doses as needed for chest pain. 25 tablet 3   atorvastatin (LIPITOR) 40 MG tablet Take 1 tablet (40 mg total) by mouth daily. 30 tablet 1   clopidogrel (PLAVIX) 75 MG tablet Take 1 tablet (75 mg total) by mouth daily. 90 tablet 2   lisinopril (ZESTRIL) 20 MG tablet Take 1 tablet (20 mg total) by mouth daily. 90 tablet 2   metoprolol tartrate (LOPRESSOR) 25 MG tablet TAKE (1) TABLET TWICE DAILY.  180 tablet 2   No current facility-administered medications for this visit.   Allergies:  Cephalexin   Social History: The patient  reports that he has been smoking cigarettes. He started smoking about 46 years ago. He has a 35.00 pack-year smoking history. He has never used smokeless tobacco. He reports current drug use. Frequency: 6.00 times per week. Drugs: Hydromorphone and Opium. He reports that he does not drink alcohol.   Family History: The patient's family history includes Cancer in his mother; Coronary artery disease in his father; Diabetes type II in his father; Heart attack in his father; Heart disease in  his father; Hyperlipidemia in his father; Hypertension in his father.   ROS:  Please see the history of present illness. Otherwise, complete review of systems is positive for none.  All other systems are reviewed and negative.   Physical Exam: VS:  BP 124/86   Pulse 95   Ht '5\' 10"'$  (1.778 m)   Wt 177 lb (80.3 kg)   SpO2 95%   BMI 25.40 kg/m , BMI Body mass index is 25.4 kg/m.  Wt Readings from Last 3 Encounters:  09/23/22 177 lb (80.3 kg)  08/14/21 177 lb (80.3 kg)  07/21/20 175 lb 6.4 oz (79.6 kg)    General: Patient appears comfortable at rest. HEENT: Conjunctiva and lids normal, oropharynx clear with moist mucosa. Neck: Supple, no elevated JVP or carotid bruits, no thyromegaly. Lungs: Clear to auscultation, nonlabored breathing at rest. Cardiac: Regular rate and rhythm, no S3 or significant systolic murmur, no pericardial rub. Abdomen: Soft, nontender, no hepatomegaly, bowel sounds present, no guarding or rebound. Extremities: No pitting edema, distal pulses 2+. Skin: Warm and dry. Musculoskeletal: No kyphosis. Neuropsychiatric: Alert and oriented x3, affect grossly appropriate.  ECG:  An ECG dated 09/23/2022 was personally reviewed today and demonstrated:  Normal sinus rhythm  Recent Labwork: No results found for requested labs within last 365 days.     Component Value Date/Time   CHOL 195 01/06/2012 0500   TRIG 149 01/06/2012 0500   HDL 52 01/06/2012 0500   CHOLHDL 3.8 01/06/2012 0500   VLDL 30 01/06/2012 0500   LDLCALC 113 (H) 01/06/2012 0500    Other Studies Reviewed Today: [facility] 2013 : RCA ISR with left-to-right collaterals, mid LAD 50% stenosis, LCx and OM 3 PCI  Echo : Normal LVEF  Assessment and Plan: Patient is a 61 year old M known to have CAD s/p BMS to LAD and RCA in 2000, NSTEMI in 2013 s/p OM 3 and LCx PCI with residual RCA ISR with left-to-right collaterals and mid LAD 50% stenosis with normal LVEF, HTN, HLD, nicotine abuse presented to cardiology  clinic for follow-up visit.  #CAD s/p BMS to LAD and RCA in 2000, NSTEMI in 2013 s/p OM 3 and LCx PCI with residual RCA ISR with left-to-right collaterals and mid LAD 50% stenosis with normal LVEF, currently angina free Plan -Continue DAPT indefinitely (aspirin 81 mg once daily and Plavix 75 mg once daily) -Continue atorvastatin 40 mg nightly -Continue metoprolol tartrate 25 mg twice daily -Continue lisinopril 20 mg once daily  #HLD, unknown values Plan -Continue atorvastatin 40 mg nightly. Obtain lipid panel today.  #HTN, controlled Plan -Continue metoprolol tartrate 25 mg twice daily -Continue lisinopril 20 mg once daily  #Nicotine abuse Plan -Smoking cessation counseling provided.  Patient instructed to cut down cigarettes. He stated he is finding it very hard to quit smoking or even to cut down cigarettes.  I have spent a total of  32 minutes with patient reviewing chart, EKGs, labs and examining patient as well as establishing an assessment and plan that was discussed with the patient.  > 50% of time was spent in direct patient care.      Medication Adjustments/Labs and Tests Ordered: Current medicines are reviewed at length with the patient today.  Concerns regarding medicines are outlined above.   Tests Ordered: Orders Placed This Encounter  Procedures   Lipid panel   EKG 12-Lead    Medication Changes: Meds ordered this encounter  Medications   metoprolol tartrate (LOPRESSOR) 25 MG tablet    Sig: TAKE (1) TABLET TWICE DAILY.    Dispense:  180 tablet    Refill:  2   lisinopril (ZESTRIL) 20 MG tablet    Sig: Take 1 tablet (20 mg total) by mouth daily.    Dispense:  90 tablet    Refill:  2   clopidogrel (PLAVIX) 75 MG tablet    Sig: Take 1 tablet (75 mg total) by mouth daily.    Dispense:  90 tablet    Refill:  2   atorvastatin (LIPITOR) 40 MG tablet    Sig: Take 1 tablet (40 mg total) by mouth daily.    Dispense:  30 tablet    Refill:  1    NEED LIPIDS     Disposition:  Follow up  one year  Signed Naren Benally Fidel Levy, MD, 09/23/2022 8:24 PM    [facility], [place], [place] [address]

## 2022-09-23 NOTE — Patient Instructions (Addendum)
Medication Instructions:  Your physician recommends that you continue on your current medications as directed. Please refer to the Current Medication list given to you today.  Labwork: Lipid Panel today at [facility] Non-fasting  Testing/Procedures: none  Follow-Up: Your physician recommends that you schedule a follow-up appointment in: 1 year. You will receive a reminder call in the mail in about 10 months reminding you to call and schedule your appointment. If you don't receive this call, please contact our office.  Any Other Special Instructions Will Be Listed Below (If Applicable).  If you need a refill on your cardiac medications before your next appointment, please call your pharmacy.

## 2022-09-27 DIAGNOSIS — E782 Mixed hyperlipidemia: Secondary | ICD-10-CM | POA: Diagnosis not present

## 2022-09-28 ENCOUNTER — Other Ambulatory Visit: Payer: Self-pay | Admitting: Student

## 2022-09-28 ENCOUNTER — Encounter: Payer: Self-pay | Admitting: *Deleted

## 2022-10-06 ENCOUNTER — Other Ambulatory Visit: Payer: Self-pay | Admitting: *Deleted

## 2022-10-06 MED ORDER — EZETIMIBE 10 MG PO TABS: 10.0000 mg | ORAL_TABLET | Freq: Every day | ORAL | 1 refills | Status: DC

## 2022-10-07 ENCOUNTER — Encounter: Payer: Self-pay | Admitting: Internal Medicine

## 2022-10-08 DIAGNOSIS — B029 Zoster without complications: Secondary | ICD-10-CM | POA: Diagnosis not present

## 2022-10-08 DIAGNOSIS — I10 Essential (primary) hypertension: Secondary | ICD-10-CM | POA: Diagnosis not present

## 2022-10-08 DIAGNOSIS — Z299 Encounter for prophylactic measures, unspecified: Secondary | ICD-10-CM | POA: Diagnosis not present

## 2022-10-18 DIAGNOSIS — I10 Essential (primary) hypertension: Secondary | ICD-10-CM | POA: Diagnosis not present

## 2022-10-18 DIAGNOSIS — R6884 Jaw pain: Secondary | ICD-10-CM | POA: Diagnosis not present

## 2022-10-18 DIAGNOSIS — Z299 Encounter for prophylactic measures, unspecified: Secondary | ICD-10-CM | POA: Diagnosis not present

## 2022-11-01 ENCOUNTER — Other Ambulatory Visit: Payer: Self-pay | Admitting: *Deleted

## 2022-11-01 MED ORDER — EZETIMIBE 10 MG PO TABS: 10.0000 mg | ORAL_TABLET | Freq: Every day | ORAL | 0 refills | Status: DC

## 2022-11-01 NOTE — Telephone Encounter (Signed)
Adrian Rivera "Adrian Rivera"  You2 days ago    Can you refill my Ezeyimibe on or before 11/05/22? Please only request only 15 pills. This will get the refill dates in sync with my other meds. As of now, I will have to go to the pharmacy twice a month. Thank you, [name]

## 2022-12-30 ENCOUNTER — Other Ambulatory Visit: Payer: Self-pay | Admitting: Student

## 2023-02-28 ENCOUNTER — Other Ambulatory Visit: Payer: Self-pay | Admitting: Cardiology

## 2023-03-30 ENCOUNTER — Other Ambulatory Visit: Payer: Self-pay | Admitting: Internal Medicine

## 2023-05-01 ENCOUNTER — Other Ambulatory Visit: Payer: Self-pay | Admitting: Internal Medicine

## 2023-07-06 ENCOUNTER — Telehealth: Payer: Self-pay | Admitting: Internal Medicine

## 2023-07-06 NOTE — Telephone Encounter (Signed)
*  STAT* If patient is at the pharmacy, call can be transferred to refill team.   1. Which medications need to be refilled? (please list name of each medication and dose if known) atorvastatin (LIPITOR) 40 MG tablet clopidogrel (PLAVIX) 75 MG tablet ezetimibe (ZETIA) 10 MG tablet lisinopril (ZESTRIL) 20 MG tablet metoprolol tartrate (LOPRESSOR) 25 MG tablet  2. Which pharmacy/location (including street and city if local pharmacy) is medication to be sent to?[facility] - [place], [place] - [address]  3. Do they need a 30 day or 90 day supply? 30 Day Supply for each medication.  Pt has an appointment scheduled for 11/01

## 2023-07-07 MED ORDER — LISINOPRIL 20 MG PO TABS: 20.0000 mg | ORAL_TABLET | Freq: Every day | ORAL | 2 refills | Status: DC

## 2023-07-07 MED ORDER — CLOPIDOGREL BISULFATE 75 MG PO TABS: 75.0000 mg | ORAL_TABLET | Freq: Every day | ORAL | 2 refills | Status: DC

## 2023-07-07 MED ORDER — ATORVASTATIN CALCIUM 40 MG PO TABS: 40.0000 mg | ORAL_TABLET | Freq: Every day | ORAL | 2 refills | Status: DC

## 2023-07-07 MED ORDER — METOPROLOL TARTRATE 25 MG PO TABS: ORAL_TABLET | ORAL | 2 refills | Status: DC

## 2023-07-07 NOTE — Telephone Encounter (Signed)
Refilled as requested  

## 2023-09-23 ENCOUNTER — Ambulatory Visit: Payer: BC Managed Care – PPO | Attending: Nurse Practitioner | Admitting: Nurse Practitioner

## 2023-09-23 ENCOUNTER — Encounter: Payer: Self-pay | Admitting: Nurse Practitioner

## 2023-09-23 VITALS — BP 132/84 | HR 76 | Ht 70.0 in | Wt 174.8 lb

## 2023-09-23 DIAGNOSIS — I2510 Atherosclerotic heart disease of native coronary artery without angina pectoris: Secondary | ICD-10-CM

## 2023-09-23 DIAGNOSIS — Z720 Tobacco use: Secondary | ICD-10-CM | POA: Diagnosis not present

## 2023-09-23 DIAGNOSIS — I10 Essential (primary) hypertension: Secondary | ICD-10-CM

## 2023-09-23 DIAGNOSIS — I4729 Other ventricular tachycardia: Secondary | ICD-10-CM

## 2023-09-23 DIAGNOSIS — E785 Hyperlipidemia, unspecified: Secondary | ICD-10-CM | POA: Diagnosis not present

## 2023-09-23 NOTE — Progress Notes (Unsigned)
  Cardiology Office Note:  .   Date:  09/23/2023 ID:  [name], DOB 08/13/1961, MRN [id number] PCP: Ignatius Specking, MD  [facility] Providers Cardiologist:  Marjo Bicker, MD    History of Present Illness: .   Adrian Rivera is a 62 y.o. male with a PMH of CAD, hyperlipidemia, hypertension, and tobacco abuse, who presents today for 1 year follow-up.  Previous cardiovascular history of bare-metal stent to RCA and LAD in 2000.  NSTEMI in 2013, status post PCI to OM 3 and left circumflex and was found to have residual RCA ISR with left-to-right collaterals, mid LAD stenosis at 50%.   Last seen by Dr. Jenene Slicker on September 23, 2022.  Was overall doing well from a cardiac perspective at the time.  He was smoking 1 pack/day, was trying to quit smoking.   Today he presents for 1 year follow-up.  He denies any changes to his health since last office visit. Denies any chest pain, shortness of breath, palpitations, syncope, presyncope, dizziness, orthopnea, PND, swelling or significant weight changes, acute bleeding, or claudication. Still smoking 1 PPD, motivated to quit smoking.   ROS: Negative. See HPI.   EKG Interpretation Date/Time:  Friday September 23 2023 15:42:52 EDT Ventricular Rate:  64 PR Interval:  120 QRS Duration:  88 QT Interval:  406 QTC Calculation: 418 R Axis:   -36  Text Interpretation: Sinus rhythm with Premature atrial complexes with Abberant conduction Left axis deviation Nonspecific ST and T wave abnormality When compared with ECG of 06-Jan-2012 06:15, Abberant conduction is now Present QRS axis Shifted left ST elevation now present in Anterior leads Confirmed by Sharlene Dory [phone]) on 09/23/2023 3:45:01 PM    Physical Exam:   VS:  BP 132/84   Pulse 76   Ht 5\' 10"  (1.778 m)   Wt 174 lb 12.8 oz (79.3 kg)   SpO2 97%   BMI 25.08 kg/m    Wt Readings from Last 3 Encounters:  09/23/23 174 lb 12.8 oz (79.3 kg)  09/23/22 177 lb (80.3 kg)   08/14/21 177 lb (80.3 kg)    GEN: Well nourished, well developed in no acute distress NECK: No JVD; No carotid bruits CARDIAC: S1/S2, RRR, no murmurs, rubs, gallops RESPIRATORY:  Clear to auscultation without rales, wheezing or rhonchi  ABDOMEN: Soft, non-tender, non-distended EXTREMITIES:  No edema; No deformity   ASSESSMENT AND PLAN: .    CAD Stable with no anginal symptoms. No indication for ischemic evaluation.  Continue aspirin, atorvastatin, Plavix, Zetia, lisinopril, metoprolol tartrate, and nitroglycerin as needed. Heart healthy diet and regular cardiovascular exercise encouraged.   HLD No recent labs on file.  He is due for upcoming labs with PCP.  Have requested these labs are faxed to our office when these are available.  Continue atorvastatin. Heart healthy diet and regular cardiovascular exercise encouraged.   HTN BP borderline elevated. BP well controlled per his report. Discussed to monitor BP at home at least 2 hours after medications and sitting for 5-10 minutes.  Discussed SBP goal is less than 130. No medication changes at this time. He will notify our office if SBP is consistently > 130. Heart healthy diet and regular cardiovascular exercise encouraged.   Tobacco abuse Smoking cessation encouraged and discussed.   Dispo: Follow-up with Dr. Jenene Slicker or APP in 1 year or sooner if anything changes.   Signed, Sharlene Dory, NP

## 2023-09-23 NOTE — Patient Instructions (Addendum)
Medication Instructions:  Your physician recommends that you continue on your current medications as directed. Please refer to the Current Medication list given to you today.   Labwork: Please have Primary care physician fax over labs when completed Fax number [phone]  Testing/Procedures: None  Follow-Up: Your physician recommends that you schedule a follow-up appointment in: 1 year. You will receive a reminder call in about months reminding you to schedule your appointment. If you don't receive this call, please contact our office.   Any Other Special Instructions Will Be Listed Below (If Applicable).  Thank you for choosing [facility]!      If you need a refill on your cardiac medications before your next appointment, please call your pharmacy.

## 2023-10-14 ENCOUNTER — Other Ambulatory Visit: Payer: Self-pay | Admitting: Internal Medicine

## 2023-12-14 ENCOUNTER — Other Ambulatory Visit: Payer: Self-pay | Admitting: Cardiology

## 2024-02-06 DIAGNOSIS — Z0000 Encounter for general adult medical examination without abnormal findings: Secondary | ICD-10-CM | POA: Diagnosis not present

## 2024-02-06 DIAGNOSIS — F17210 Nicotine dependence, cigarettes, uncomplicated: Secondary | ICD-10-CM | POA: Diagnosis not present

## 2024-02-06 DIAGNOSIS — Z299 Encounter for prophylactic measures, unspecified: Secondary | ICD-10-CM | POA: Diagnosis not present

## 2024-02-06 DIAGNOSIS — E7800 Pure hypercholesterolemia, unspecified: Secondary | ICD-10-CM | POA: Diagnosis not present

## 2024-02-06 DIAGNOSIS — Z79899 Other long term (current) drug therapy: Secondary | ICD-10-CM | POA: Diagnosis not present

## 2024-02-06 DIAGNOSIS — Z1331 Encounter for screening for depression: Secondary | ICD-10-CM | POA: Diagnosis not present

## 2024-02-06 DIAGNOSIS — I10 Essential (primary) hypertension: Secondary | ICD-10-CM | POA: Diagnosis not present

## 2024-02-07 DIAGNOSIS — Z0000 Encounter for general adult medical examination without abnormal findings: Secondary | ICD-10-CM | POA: Diagnosis not present

## 2024-02-07 DIAGNOSIS — E7800 Pure hypercholesterolemia, unspecified: Secondary | ICD-10-CM | POA: Diagnosis not present

## 2024-02-07 DIAGNOSIS — Z79899 Other long term (current) drug therapy: Secondary | ICD-10-CM | POA: Diagnosis not present

## 2024-02-07 DIAGNOSIS — Z125 Encounter for screening for malignant neoplasm of prostate: Secondary | ICD-10-CM | POA: Diagnosis not present

## 2024-03-18 ENCOUNTER — Other Ambulatory Visit: Payer: Self-pay | Admitting: Internal Medicine

## 2024-03-22 ENCOUNTER — Other Ambulatory Visit: Payer: Self-pay | Admitting: Internal Medicine

## 2024-04-17 ENCOUNTER — Other Ambulatory Visit: Payer: Self-pay | Admitting: Internal Medicine

## 2024-05-22 ENCOUNTER — Other Ambulatory Visit: Payer: Self-pay | Admitting: Internal Medicine

## 2024-07-25 ENCOUNTER — Other Ambulatory Visit: Payer: Self-pay | Admitting: Internal Medicine

## 2024-08-27 ENCOUNTER — Other Ambulatory Visit: Payer: Self-pay | Admitting: Internal Medicine

## 2024-09-24 ENCOUNTER — Other Ambulatory Visit: Payer: Self-pay | Admitting: Internal Medicine

## 2024-10-27 ENCOUNTER — Other Ambulatory Visit: Payer: Self-pay | Admitting: Internal Medicine

## 2024-11-26 ENCOUNTER — Other Ambulatory Visit: Payer: Self-pay | Admitting: Internal Medicine

## 2024-11-26 NOTE — Telephone Encounter (Signed)
 PLEASE MAKE AN APPOINTMENT IN ORDER TO RECEIVE ADDITIONAL REFILLS, 3RD LAST ATTEMPT.  [phone]

## 2024-12-13 ENCOUNTER — Other Ambulatory Visit: Payer: Self-pay | Admitting: Internal Medicine

## 2024-12-26 ENCOUNTER — Encounter: Payer: Self-pay | Admitting: Nurse Practitioner

## 2024-12-26 ENCOUNTER — Ambulatory Visit: Payer: Self-pay | Admitting: Nurse Practitioner

## 2024-12-26 VITALS — BP 154/90 | HR 78 | Ht 70.0 in | Wt 176.6 lb

## 2024-12-26 DIAGNOSIS — I10 Essential (primary) hypertension: Secondary | ICD-10-CM | POA: Diagnosis not present

## 2024-12-26 DIAGNOSIS — I2510 Atherosclerotic heart disease of native coronary artery without angina pectoris: Secondary | ICD-10-CM | POA: Diagnosis not present

## 2024-12-26 DIAGNOSIS — Z720 Tobacco use: Secondary | ICD-10-CM | POA: Diagnosis not present

## 2024-12-26 DIAGNOSIS — E785 Hyperlipidemia, unspecified: Secondary | ICD-10-CM | POA: Diagnosis not present

## 2024-12-26 MED ORDER — METOPROLOL TARTRATE 25 MG PO TABS: ORAL_TABLET | ORAL | 3 refills | Status: AC

## 2024-12-26 MED ORDER — CLOPIDOGREL BISULFATE 75 MG PO TABS: 75.0000 mg | ORAL_TABLET | Freq: Every day | ORAL | 3 refills | Status: AC

## 2024-12-26 MED ORDER — EZETIMIBE 10 MG PO TABS: 10.0000 mg | ORAL_TABLET | Freq: Every day | ORAL | 3 refills | Status: AC

## 2024-12-26 MED ORDER — LISINOPRIL 20 MG PO TABS: 20.0000 mg | ORAL_TABLET | Freq: Every day | ORAL | 3 refills | Status: AC

## 2024-12-26 MED ORDER — NITROGLYCERIN 0.4 MG SL SUBL: 0.4000 mg | SUBLINGUAL_TABLET | SUBLINGUAL | 3 refills | Status: AC | PRN

## 2024-12-26 MED ORDER — ATORVASTATIN CALCIUM 40 MG PO TABS: 40.0000 mg | ORAL_TABLET | Freq: Every day | ORAL | 3 refills | Status: AC

## 2024-12-26 NOTE — Progress Notes (Unsigned)
 " Cardiology Office Note:  .   Date:  12/26/2024 ID:  Adrian Rivera, DOB 16-Dec-1960, MRN [id number] PCP: Rosamond Leta NOVAK, MD  [facility] Providers Cardiologist:  Diannah SHAUNNA Maywood, MD    History of Present Illness: .   Adrian Rivera is a 64 y.o. male with a PMH of CAD, hyperlipidemia, hypertension, and tobacco abuse, who presents today for 1 year follow-up.  Previous cardiovascular history of bare-metal stent to RCA and LAD in 2000.  NSTEMI in 2013, status post PCI to OM 3 and left circumflex and was found to have residual RCA ISR with left-to-right collaterals, mid LAD stenosis at 50%.   Last seen by Dr. Mallipeddi on September 23, 2022.  Was overall doing well from a cardiac perspective at the time.  He was smoking 1 pack/day, was trying to quit smoking.   09/23/2023 - Today he presents for 1 year follow-up.  He denies any changes to his health since last office visit. Denies any chest pain, shortness of breath, palpitations, syncope, presyncope, dizziness, orthopnea, PND, swelling or significant weight changes, acute bleeding, or claudication. Still smoking 1 PPD, motivated to quit smoking.   12/26/2024 - Here for overdue 1 year follow-up. Doing well. Denies any acute complaints. Denies any chest pain, shortness of breath, palpitations, syncope, presyncope, dizziness, orthopnea, PND, swelling or significant weight changes, acute bleeding, or claudication.  ROS: Negative. See HPI.   EKG Interpretation Date/Time:  Wednesday December 26 2024 09:19:40 EST Ventricular Rate:  78 PR Interval:  132 QRS Duration:  82 QT Interval:  390 QTC Calculation: 444 R Axis:   -57  Text Interpretation: Normal sinus rhythm Left anterior fascicular block When compared with ECG of 23-Sep-2023 15:42, Abberant conduction is no longer Present Confirmed by Miriam Norris [phone]) on 12/26/2024 9:21:24 AM    Physical Exam:   VS:  BP 129/70   Pulse 78   Ht 5' 10 (1.778 m)   Wt 176 lb 9.6 oz  (80.1 kg)   SpO2 96%   BMI 25.34 kg/m    Wt Readings from Last 3 Encounters:  12/26/24 176 lb 9.6 oz (80.1 kg)  09/23/23 174 lb 12.8 oz (79.3 kg)  09/23/22 177 lb (80.3 kg)    GEN: Well nourished, well developed in no acute distress NECK: No JVD; No carotid bruits CARDIAC: S1/S2, RRR, no murmurs, rubs, gallops RESPIRATORY:  Clear to auscultation without rales, wheezing or rhonchi  ABDOMEN: Soft, non-tender, non-distended EXTREMITIES:  No edema; No deformity   ASSESSMENT AND PLAN: .    CAD Stable with no anginal symptoms. No indication for ischemic evaluation.  Continue aspirin , atorvastatin , Plavix , Zetia , lisinopril , metoprolol  tartrate, and nitroglycerin  as needed. Heart healthy diet and regular cardiovascular exercise encouraged. Will provide refills per his report.   HLD LDL 35 from last year. He is due for upcoming labs with PCP.  Have requested these labs are faxed to our office when these are available.  Continue atorvastatin . Heart healthy diet and regular cardiovascular exercise encouraged.   HTN BP on recheck is stable. BP well controlled per his report. Discussed to monitor BP at home at least 2 hours after medications and sitting for 5-10 minutes.  Discussed SBP goal is less than 130. No medication changes at this time. He will notify our office if SBP is consistently > 130. Heart healthy diet and regular cardiovascular exercise encouraged.   Tobacco abuse Smoking cessation encouraged and discussed.   Dispo: Follow-up with Dr. Mallipeddi or  APP in 1 year or sooner if anything changes.   Signed, Almarie Crate, NP   "

## 2024-12-26 NOTE — Patient Instructions (Addendum)
Medication Instructions:  Your physician recommends that you continue on your current medications as directed. Please refer to the Current Medication list given to you today.   Labwork: None  Testing/Procedures: None  Follow-Up: Your physician recommends that you schedule a follow-up appointment in: 1 year. You will receive a reminder call in about months reminding you to schedule your appointment. If you don't receive this call, please contact our office.   Any Other Special Instructions Will Be Listed Below (If Applicable).  Thank you for choosing [facility]!      If you need a refill on your cardiac medications before your next appointment, please call your pharmacy.
